# Patient Record
Sex: Male | Born: 1955 | Hispanic: No | Marital: Single | Smoking: Former smoker
Health system: Southern US, Community
[De-identification: ages and names within clinical notes are randomized; demographics above are authoritative.]

## PROBLEM LIST (undated history)

## (undated) DIAGNOSIS — J189 Pneumonia, unspecified organism: Secondary | ICD-10-CM

## (undated) DIAGNOSIS — Z9289 Personal history of other medical treatment: Secondary | ICD-10-CM

## (undated) HISTORY — PX: FRACTURE SURGERY: SHX138

---

## 2018-09-09 ENCOUNTER — Encounter (HOSPITAL_COMMUNITY): Admission: EM | Disposition: A | Discharge status: Skilled Nursing Facility | Payer: Self-pay | Source: Home / Self Care

## 2018-09-09 ENCOUNTER — Emergency Department (HOSPITAL_COMMUNITY): Payer: No Typology Code available for payment source

## 2018-09-09 ENCOUNTER — Encounter (HOSPITAL_COMMUNITY): Payer: Self-pay | Admitting: Emergency Medicine

## 2018-09-09 ENCOUNTER — Emergency Department (HOSPITAL_COMMUNITY): Payer: No Typology Code available for payment source | Admitting: Certified Registered Nurse Anesthetist

## 2018-09-09 ENCOUNTER — Inpatient Hospital Stay (HOSPITAL_COMMUNITY): Payer: No Typology Code available for payment source

## 2018-09-09 ENCOUNTER — Inpatient Hospital Stay (HOSPITAL_COMMUNITY)
Admission: EM | Admit: 2018-09-09 | Discharge: 2018-09-25 | DRG: 956 | Disposition: A | Discharge status: Skilled Nursing Facility | Payer: No Typology Code available for payment source | Attending: General Surgery | Admitting: General Surgery

## 2018-09-09 ENCOUNTER — Emergency Department (HOSPITAL_COMMUNITY): Payer: No Typology Code available for payment source | Admitting: Anesthesiology

## 2018-09-09 DIAGNOSIS — S2243XA Multiple fractures of ribs, bilateral, initial encounter for closed fracture: Secondary | ICD-10-CM | POA: Diagnosis present

## 2018-09-09 DIAGNOSIS — S32049A Unspecified fracture of fourth lumbar vertebra, initial encounter for closed fracture: Secondary | ICD-10-CM | POA: Diagnosis present

## 2018-09-09 DIAGNOSIS — R579 Shock, unspecified: Secondary | ICD-10-CM

## 2018-09-09 DIAGNOSIS — N390 Urinary tract infection, site not specified: Secondary | ICD-10-CM | POA: Diagnosis present

## 2018-09-09 DIAGNOSIS — S2231XA Fracture of one rib, right side, initial encounter for closed fracture: Secondary | ICD-10-CM

## 2018-09-09 DIAGNOSIS — S0219XA Other fracture of base of skull, initial encounter for closed fracture: Secondary | ICD-10-CM | POA: Diagnosis present

## 2018-09-09 DIAGNOSIS — S0282XA Fracture of other specified skull and facial bones, left side, initial encounter for closed fracture: Secondary | ICD-10-CM | POA: Diagnosis present

## 2018-09-09 DIAGNOSIS — S7292XA Unspecified fracture of left femur, initial encounter for closed fracture: Secondary | ICD-10-CM

## 2018-09-09 DIAGNOSIS — J9690 Respiratory failure, unspecified, unspecified whether with hypoxia or hypercapnia: Secondary | ICD-10-CM

## 2018-09-09 DIAGNOSIS — S12500A Unspecified displaced fracture of sixth cervical vertebra, initial encounter for closed fracture: Secondary | ICD-10-CM | POA: Diagnosis present

## 2018-09-09 DIAGNOSIS — S82832A Other fracture of upper and lower end of left fibula, initial encounter for closed fracture: Secondary | ICD-10-CM | POA: Diagnosis present

## 2018-09-09 DIAGNOSIS — S0181XA Laceration without foreign body of other part of head, initial encounter: Secondary | ICD-10-CM

## 2018-09-09 DIAGNOSIS — S27329A Contusion of lung, unspecified, initial encounter: Secondary | ICD-10-CM

## 2018-09-09 DIAGNOSIS — S72002A Fracture of unspecified part of neck of left femur, initial encounter for closed fracture: Secondary | ICD-10-CM

## 2018-09-09 DIAGNOSIS — S065X9A Traumatic subdural hemorrhage with loss of consciousness of unspecified duration, initial encounter: Secondary | ICD-10-CM | POA: Diagnosis present

## 2018-09-09 DIAGNOSIS — Z8781 Personal history of (healed) traumatic fracture: Secondary | ICD-10-CM

## 2018-09-09 DIAGNOSIS — S299XXA Unspecified injury of thorax, initial encounter: Secondary | ICD-10-CM

## 2018-09-09 DIAGNOSIS — I471 Supraventricular tachycardia: Secondary | ICD-10-CM | POA: Diagnosis present

## 2018-09-09 DIAGNOSIS — J9601 Acute respiratory failure with hypoxia: Secondary | ICD-10-CM | POA: Diagnosis present

## 2018-09-09 DIAGNOSIS — Z419 Encounter for procedure for purposes other than remedying health state, unspecified: Secondary | ICD-10-CM

## 2018-09-09 DIAGNOSIS — N5089 Other specified disorders of the male genital organs: Secondary | ICD-10-CM | POA: Diagnosis present

## 2018-09-09 DIAGNOSIS — S82209A Unspecified fracture of shaft of unspecified tibia, initial encounter for closed fracture: Secondary | ICD-10-CM

## 2018-09-09 DIAGNOSIS — S01112A Laceration without foreign body of left eyelid and periocular area, initial encounter: Secondary | ICD-10-CM | POA: Diagnosis present

## 2018-09-09 DIAGNOSIS — Z452 Encounter for adjustment and management of vascular access device: Secondary | ICD-10-CM

## 2018-09-09 DIAGNOSIS — S72451A Displaced supracondylar fracture without intracondylar extension of lower end of right femur, initial encounter for closed fracture: Secondary | ICD-10-CM | POA: Diagnosis present

## 2018-09-09 DIAGNOSIS — S2249XA Multiple fractures of ribs, unspecified side, initial encounter for closed fracture: Secondary | ICD-10-CM

## 2018-09-09 DIAGNOSIS — S82899A Other fracture of unspecified lower leg, initial encounter for closed fracture: Secondary | ICD-10-CM

## 2018-09-09 DIAGNOSIS — S020XXB Fracture of vault of skull, initial encounter for open fracture: Secondary | ICD-10-CM | POA: Diagnosis present

## 2018-09-09 DIAGNOSIS — S32059A Unspecified fracture of fifth lumbar vertebra, initial encounter for closed fracture: Secondary | ICD-10-CM | POA: Diagnosis present

## 2018-09-09 DIAGNOSIS — S32810A Multiple fractures of pelvis with stable disruption of pelvic ring, initial encounter for closed fracture: Secondary | ICD-10-CM | POA: Diagnosis present

## 2018-09-09 DIAGNOSIS — R52 Pain, unspecified: Secondary | ICD-10-CM

## 2018-09-09 DIAGNOSIS — S0240FA Zygomatic fracture, left side, initial encounter for closed fracture: Secondary | ICD-10-CM | POA: Diagnosis present

## 2018-09-09 DIAGNOSIS — Y92410 Unspecified street and highway as the place of occurrence of the external cause: Secondary | ICD-10-CM

## 2018-09-09 DIAGNOSIS — S72142A Displaced intertrochanteric fracture of left femur, initial encounter for closed fracture: Secondary | ICD-10-CM | POA: Diagnosis present

## 2018-09-09 DIAGNOSIS — S7222XA Displaced subtrochanteric fracture of left femur, initial encounter for closed fracture: Secondary | ICD-10-CM

## 2018-09-09 DIAGNOSIS — S27321A Contusion of lung, unilateral, initial encounter: Secondary | ICD-10-CM

## 2018-09-09 DIAGNOSIS — S82202A Unspecified fracture of shaft of left tibia, initial encounter for closed fracture: Secondary | ICD-10-CM

## 2018-09-09 DIAGNOSIS — D62 Acute posthemorrhagic anemia: Secondary | ICD-10-CM | POA: Diagnosis present

## 2018-09-09 DIAGNOSIS — S0240DA Maxillary fracture, left side, initial encounter for closed fracture: Secondary | ICD-10-CM | POA: Diagnosis present

## 2018-09-09 DIAGNOSIS — S7291XA Unspecified fracture of right femur, initial encounter for closed fracture: Secondary | ICD-10-CM

## 2018-09-09 DIAGNOSIS — S022XXA Fracture of nasal bones, initial encounter for closed fracture: Secondary | ICD-10-CM | POA: Diagnosis present

## 2018-09-09 DIAGNOSIS — S82252A Displaced comminuted fracture of shaft of left tibia, initial encounter for closed fracture: Secondary | ICD-10-CM | POA: Diagnosis present

## 2018-09-09 DIAGNOSIS — D696 Thrombocytopenia, unspecified: Secondary | ICD-10-CM | POA: Diagnosis present

## 2018-09-09 DIAGNOSIS — S42102A Fracture of unspecified part of scapula, left shoulder, initial encounter for closed fracture: Secondary | ICD-10-CM

## 2018-09-09 DIAGNOSIS — D72829 Elevated white blood cell count, unspecified: Secondary | ICD-10-CM

## 2018-09-09 DIAGNOSIS — Z9289 Personal history of other medical treatment: Secondary | ICD-10-CM

## 2018-09-09 DIAGNOSIS — S42112A Displaced fracture of body of scapula, left shoulder, initial encounter for closed fracture: Secondary | ICD-10-CM | POA: Diagnosis present

## 2018-09-09 DIAGNOSIS — S329XXA Fracture of unspecified parts of lumbosacral spine and pelvis, initial encounter for closed fracture: Secondary | ICD-10-CM

## 2018-09-09 DIAGNOSIS — R58 Hemorrhage, not elsewhere classified: Secondary | ICD-10-CM

## 2018-09-09 HISTORY — PX: EXTERNAL FIXATION LEG: SHX1549

## 2018-09-09 HISTORY — PX: FACIAL LACERATION REPAIR: SHX6589

## 2018-09-09 HISTORY — DX: Personal history of other medical treatment: Z92.89

## 2018-09-09 HISTORY — DX: Pneumonia, unspecified organism: J18.9

## 2018-09-09 LAB — POCT I-STAT 7, (LYTES, BLD GAS, ICA,H+H)
Acid-base deficit: 2 mmol/L (ref 0.0–2.0)
Acid-base deficit: 1 mmol/L (ref 0.0–2.0)
Bicarbonate: 25.6 mmol/L (ref 20.0–28.0)
Bicarbonate: 24.6 mmol/L (ref 20.0–28.0)
CALCIUM ION: 1.11 mmol/L — AB (ref 1.15–1.40)
Calcium, Ion: 1.08 mmol/L — ABNORMAL LOW (ref 1.15–1.40)
HEMATOCRIT: 33 % — AB (ref 39.0–52.0)
HEMATOCRIT: 27 % — AB (ref 39.0–52.0)
Hemoglobin: 11.2 g/dL — ABNORMAL LOW (ref 13.0–17.0)
Hemoglobin: 9.2 g/dL — ABNORMAL LOW (ref 13.0–17.0)
O2 SAT: 99 %
O2 SAT: 100 %
PCO2 ART: 43.4 mmHg (ref 32.0–48.0)
PO2 ART: 218 mmHg — AB (ref 83.0–108.0)
POTASSIUM: 3.7 mmol/L (ref 3.5–5.1)
Patient temperature: 35.2
Patient temperature: 36
Potassium: 3.5 mmol/L (ref 3.5–5.1)
SODIUM: 140 mmol/L (ref 135–145)
Sodium: 139 mmol/L (ref 135–145)
TCO2: 27 mmol/L (ref 22–32)
TCO2: 26 mmol/L (ref 22–32)
pCO2 arterial: 54.5 mmHg — ABNORMAL HIGH (ref 32.0–48.0)
pH, Arterial: 7.27 — ABNORMAL LOW (ref 7.350–7.450)
pH, Arterial: 7.357 (ref 7.350–7.450)
pO2, Arterial: 167 mmHg — ABNORMAL HIGH (ref 83.0–108.0)

## 2018-09-09 LAB — CBC WITH DIFFERENTIAL/PLATELET
ABS IMMATURE GRANULOCYTES: 0.37 10*3/uL — AB (ref 0.00–0.07)
BASOS PCT: 0 %
Basophils Absolute: 0.1 10*3/uL (ref 0.0–0.1)
Eosinophils Absolute: 0 10*3/uL (ref 0.0–0.5)
Eosinophils Relative: 0 %
HEMATOCRIT: 32.9 % — AB (ref 39.0–52.0)
Hemoglobin: 11 g/dL — ABNORMAL LOW (ref 13.0–17.0)
IMMATURE GRANULOCYTES: 2 %
LYMPHS ABS: 2.5 10*3/uL (ref 0.7–4.0)
Lymphocytes Relative: 10 %
MCH: 29.8 pg (ref 26.0–34.0)
MCHC: 33.4 g/dL (ref 30.0–36.0)
MCV: 89.2 fL (ref 80.0–100.0)
MONOS PCT: 7 %
Monocytes Absolute: 1.6 10*3/uL — ABNORMAL HIGH (ref 0.1–1.0)
NEUTROS ABS: 20.3 10*3/uL — AB (ref 1.7–7.7)
NEUTROS PCT: 81 %
PLATELETS: 124 10*3/uL — AB (ref 150–400)
RBC: 3.69 MIL/uL — ABNORMAL LOW (ref 4.22–5.81)
RDW: 14.9 % (ref 11.5–15.5)
WBC: 24.8 10*3/uL — ABNORMAL HIGH (ref 4.0–10.5)
nRBC: 0 % (ref 0.0–0.2)

## 2018-09-09 LAB — CBC
HCT: 40.8 % (ref 39.0–52.0)
Hemoglobin: 13.1 g/dL (ref 13.0–17.0)
MCH: 30.4 pg (ref 26.0–34.0)
MCHC: 32.1 g/dL (ref 30.0–36.0)
MCV: 94.7 fL (ref 80.0–100.0)
NRBC: 1.1 % — AB (ref 0.0–0.2)
Platelets: 299 10*3/uL (ref 150–400)
RBC: 4.31 MIL/uL (ref 4.22–5.81)
RDW: 14.7 % (ref 11.5–15.5)
WBC: 23.2 10*3/uL — AB (ref 4.0–10.5)

## 2018-09-09 LAB — I-STAT ARTERIAL BLOOD GAS, ED
Acid-base deficit: 2 mmol/L (ref 0.0–2.0)
Bicarbonate: 25.7 mmol/L (ref 20.0–28.0)
O2 Saturation: 99 %
PCO2 ART: 53.4 mmHg — AB (ref 32.0–48.0)
Patient temperature: 96.5
TCO2: 27 mmol/L (ref 22–32)
pH, Arterial: 7.285 — ABNORMAL LOW (ref 7.350–7.450)
pO2, Arterial: 175 mmHg — ABNORMAL HIGH (ref 83.0–108.0)

## 2018-09-09 LAB — I-STAT CHEM 8, ED
BUN: 9 mg/dL (ref 8–23)
CHLORIDE: 105 mmol/L (ref 98–111)
CREATININE: 1 mg/dL (ref 0.61–1.24)
Calcium, Ion: 1.07 mmol/L — ABNORMAL LOW (ref 1.15–1.40)
GLUCOSE: 140 mg/dL — AB (ref 70–99)
HCT: 40 % (ref 39.0–52.0)
Hemoglobin: 13.6 g/dL (ref 13.0–17.0)
POTASSIUM: 3.8 mmol/L (ref 3.5–5.1)
Sodium: 137 mmol/L (ref 135–145)
TCO2: 23 mmol/L (ref 22–32)

## 2018-09-09 LAB — I-STAT CG4 LACTIC ACID, ED: LACTIC ACID, VENOUS: 3.4 mmol/L — AB (ref 0.5–1.9)

## 2018-09-09 LAB — COMPREHENSIVE METABOLIC PANEL
ALK PHOS: 101 U/L (ref 38–126)
ALT: 48 U/L — AB (ref 0–44)
AST: 124 U/L — ABNORMAL HIGH (ref 15–41)
Albumin: 3 g/dL — ABNORMAL LOW (ref 3.5–5.0)
Anion gap: 8 (ref 5–15)
BUN: 9 mg/dL (ref 8–23)
CALCIUM: 8.3 mg/dL — AB (ref 8.9–10.3)
CO2: 20 mmol/L — ABNORMAL LOW (ref 22–32)
CREATININE: 1 mg/dL (ref 0.61–1.24)
Chloride: 105 mmol/L (ref 98–111)
GFR, EST AFRICAN AMERICAN: 51 mL/min — AB (ref >60)
GFR, EST NON AFRICAN AMERICAN: 44 mL/min — AB (ref >60)
Glucose, Bld: 133 mg/dL — ABNORMAL HIGH (ref 70–99)
Potassium: 4.2 mmol/L (ref 3.5–5.1)
Sodium: 133 mmol/L — ABNORMAL LOW (ref 135–145)
Total Bilirubin: 1.1 mg/dL (ref 0.3–1.2)
Total Protein: 6.6 g/dL (ref 6.5–8.1)

## 2018-09-09 LAB — TRIGLYCERIDES: TRIGLYCERIDES: 25 mg/dL (ref <150)

## 2018-09-09 LAB — ETHANOL: Alcohol, Ethyl (B): <10 mg/dL (ref <10)

## 2018-09-09 LAB — PROTIME-INR
INR: 1.05
Prothrombin Time: 13.6 seconds (ref 11.4–15.2)

## 2018-09-09 SURGERY — EXTERNAL FIXATION, LOWER EXTREMITY
Anesthesia: General | Site: Leg Upper

## 2018-09-09 SURGERY — LAPAROTOMY, EXPLORATORY
Anesthesia: General

## 2018-09-09 MED ORDER — ACETAMINOPHEN 650 MG RE SUPP: 650.0000 mg | Freq: Four times a day (QID) | RECTAL | Status: DC | PRN

## 2018-09-09 MED ORDER — PROPOFOL 1000 MG/100ML IV EMUL
5.0000 ug/kg/min | INTRAVENOUS | Status: DC
  Administered 2018-09-09: 30 ug/kg/min via INTRAVENOUS
  Administered 2018-09-10: 10 ug/kg/min via INTRAVENOUS
  Administered 2018-09-10 – 2018-09-11 (×3): 20 ug/kg/min via INTRAVENOUS
  Administered 2018-09-11: 30 ug/kg/min via INTRAVENOUS
  Administered 2018-09-12 – 2018-09-13 (×4): 20 ug/kg/min via INTRAVENOUS
  Administered 2018-09-13: 30 ug/kg/min via INTRAVENOUS
  Administered 2018-09-13 – 2018-09-14 (×3): 20 ug/kg/min via INTRAVENOUS
  Administered 2018-09-14: 30 ug/kg/min via INTRAVENOUS
  Administered 2018-09-14: 14:00:00 via INTRAVENOUS
  Administered 2018-09-15: 20 ug/kg/min via INTRAVENOUS
  Filled 2018-09-09: qty 100
  Filled 2018-09-09: qty 200
  Filled 2018-09-09 (×5): qty 100
  Filled 2018-09-09: qty 200
  Filled 2018-09-09 (×5): qty 100

## 2018-09-09 MED ORDER — MIDAZOLAM HCL 5 MG/5ML IJ SOLN
INTRAMUSCULAR | Status: DC | PRN
  Administered 2018-09-09: 2 mg via INTRAVENOUS

## 2018-09-09 MED ORDER — MIDAZOLAM HCL 2 MG/2ML IJ SOLN
4.0000 mg | Freq: Once | INTRAMUSCULAR | Status: AC
  Administered 2018-09-09: 4 mg via INTRAVENOUS
  Filled 2018-09-09: qty 4

## 2018-09-09 MED ORDER — MIDAZOLAM HCL 2 MG/2ML IJ SOLN
INTRAMUSCULAR | Status: AC
  Filled 2018-09-09: qty 2

## 2018-09-09 MED ORDER — BACITRACIN ZINC 500 UNIT/GM EX OINT
TOPICAL_OINTMENT | CUTANEOUS | Status: AC
  Filled 2018-09-09: qty 28.35

## 2018-09-09 MED ORDER — ROCURONIUM BROMIDE 10 MG/ML (PF) SYRINGE
PREFILLED_SYRINGE | INTRAVENOUS | Status: DC | PRN
  Administered 2018-09-09 (×2): 50 mg via INTRAVENOUS

## 2018-09-09 MED ORDER — PROPOFOL 500 MG/50ML IV EMUL
INTRAVENOUS | Status: AC | PRN
  Administered 2018-09-09: 29.394 ug/kg/min via INTRAVENOUS

## 2018-09-09 MED ORDER — TRANEXAMIC ACID-NACL 1000-0.7 MG/100ML-% IV SOLN
1000.0000 mg | Freq: Once | INTRAVENOUS | Status: AC
  Administered 2018-09-09: 1000 mg via INTRAVENOUS
  Filled 2018-09-09 (×2): qty 100

## 2018-09-09 MED ORDER — FENTANYL BOLUS VIA INFUSION
50.0000 ug | INTRAVENOUS | Status: DC | PRN
  Administered 2018-09-10 – 2018-09-11 (×3): 50 ug via INTRAVENOUS
  Filled 2018-09-09: qty 50

## 2018-09-09 MED ORDER — FENTANYL CITRATE (PF) 250 MCG/5ML IJ SOLN
INTRAMUSCULAR | Status: AC
  Filled 2018-09-09: qty 5

## 2018-09-09 MED ORDER — FENTANYL 2500MCG IN NS 250ML (10MCG/ML) PREMIX INFUSION
25.0000 ug/h | INTRAVENOUS | Status: DC
  Administered 2018-09-10: 50 ug/h via INTRAVENOUS
  Administered 2018-09-11: 75 ug/h via INTRAVENOUS
  Administered 2018-09-12: 100 ug/h via INTRAVENOUS
  Administered 2018-09-13 – 2018-09-14 (×3): 150 ug/h via INTRAVENOUS
  Administered 2018-09-15: 200 ug/h via INTRAVENOUS
  Filled 2018-09-09 (×7): qty 250

## 2018-09-09 MED ORDER — 0.9 % SODIUM CHLORIDE (POUR BTL) OPTIME
TOPICAL | Status: DC | PRN
  Administered 2018-09-09: 1000 mL

## 2018-09-09 MED ORDER — ACETAMINOPHEN 325 MG PO TABS: 650.0000 mg | ORAL_TABLET | Freq: Four times a day (QID) | ORAL | Status: DC | PRN

## 2018-09-09 MED ORDER — VECURONIUM BROMIDE 10 MG IV SOLR
10.0000 mg | Freq: Once | INTRAVENOUS | Status: AC
  Administered 2018-09-09: 10 mg via INTRAVENOUS

## 2018-09-09 MED ORDER — FENTANYL CITRATE (PF) 250 MCG/5ML IJ SOLN
INTRAMUSCULAR | Status: DC | PRN
  Administered 2018-09-09: 50 ug via INTRAVENOUS

## 2018-09-09 MED ORDER — PHENYLEPHRINE HCL 10 MG/ML IJ SOLN
INTRAMUSCULAR | Status: DC | PRN
  Administered 2018-09-09: 120 ug via INTRAVENOUS

## 2018-09-09 MED ORDER — LACTATED RINGERS IV SOLN
INTRAVENOUS | Status: DC | PRN
  Administered 2018-09-09: 22:00:00 via INTRAVENOUS

## 2018-09-09 MED ORDER — MIDAZOLAM HCL 2 MG/2ML IJ SOLN: 2.0000 mg | INTRAMUSCULAR | Status: DC | PRN

## 2018-09-09 MED ORDER — IOHEXOL 300 MG/ML  SOLN
100.0000 mL | Freq: Once | INTRAMUSCULAR | Status: AC | PRN
  Administered 2018-09-09: 100 mL via INTRAVENOUS

## 2018-09-09 MED ORDER — MIDAZOLAM HCL 5 MG/5ML IJ SOLN
INTRAMUSCULAR | Status: AC | PRN
  Administered 2018-09-09: 4 mg via INTRAVENOUS

## 2018-09-09 MED ORDER — SODIUM CHLORIDE 0.9 % IV SOLN
INTRAVENOUS | Status: DC
  Administered 2018-09-09 – 2018-09-13 (×10): via INTRAVENOUS

## 2018-09-09 MED ORDER — SODIUM CHLORIDE 0.9 % IV SOLN
INTRAVENOUS | Status: DC | PRN
  Administered 2018-09-09: 75 ug/min via INTRAVENOUS

## 2018-09-09 MED ORDER — FENTANYL CITRATE (PF) 100 MCG/2ML IJ SOLN: 50.0000 ug | Freq: Once | INTRAMUSCULAR | Status: DC

## 2018-09-09 MED ORDER — PROPOFOL 1000 MG/100ML IV EMUL: 0.0000 ug/kg/min | INTRAVENOUS | Status: DC

## 2018-09-09 MED ORDER — ETOMIDATE 2 MG/ML IV SOLN
INTRAVENOUS | Status: AC | PRN
  Administered 2018-09-09: 30 mg via INTRAVENOUS

## 2018-09-09 MED ORDER — FAMOTIDINE IN NACL 20-0.9 MG/50ML-% IV SOLN
20.0000 mg | Freq: Two times a day (BID) | INTRAVENOUS | Status: DC
  Administered 2018-09-10 – 2018-09-17 (×15): 20 mg via INTRAVENOUS
  Filled 2018-09-09 (×15): qty 50

## 2018-09-09 MED ORDER — ALBUMIN HUMAN 5 % IV SOLN
INTRAVENOUS | Status: DC | PRN
  Administered 2018-09-09: 23:00:00 via INTRAVENOUS

## 2018-09-09 MED ORDER — SUCCINYLCHOLINE CHLORIDE 20 MG/ML IJ SOLN
INTRAMUSCULAR | Status: AC | PRN
  Administered 2018-09-09: 100 mg via INTRAVENOUS

## 2018-09-09 SURGICAL SUPPLY — 51 items
BANDAGE ACE 4X5 VEL STRL LF (GAUZE/BANDAGES/DRESSINGS) ×2 IMPLANT
BANDAGE ACE 6X5 VEL STRL LF (GAUZE/BANDAGES/DRESSINGS) ×14 IMPLANT
BANDAGE ESMARK 6X9 LF (GAUZE/BANDAGES/DRESSINGS) ×2 IMPLANT
BAR EXFX 500X11 NS LF (EXFIX) ×8
BAR GLASS FIBER EXFX 11X500 (EXFIX) ×12 IMPLANT
BNDG CMPR 9X6 STRL LF SNTH (GAUZE/BANDAGES/DRESSINGS)
BNDG COHESIVE 6X5 TAN STRL LF (GAUZE/BANDAGES/DRESSINGS) ×6 IMPLANT
BNDG ESMARK 6X9 LF (GAUZE/BANDAGES/DRESSINGS)
BNDG GAUZE ELAST 4 BULKY (GAUZE/BANDAGES/DRESSINGS) ×2 IMPLANT
CLEANER TIP ELECTROSURG 2X2 (MISCELLANEOUS) ×4 IMPLANT
COVER SURGICAL LIGHT HANDLE (MISCELLANEOUS) ×4 IMPLANT
COVER WAND RF STERILE (DRAPES) ×4 IMPLANT
DRAPE C-ARM 42X72 X-RAY (DRAPES) ×2 IMPLANT
DRAPE U-SHAPE 47X51 STRL (DRAPES) ×4 IMPLANT
ELECT REM PT RETURN 9FT ADLT (ELECTROSURGICAL)
ELECTRODE REM PT RTRN 9FT ADLT (ELECTROSURGICAL) ×2 IMPLANT
GAUZE SPONGE 4X4 12PLY STRL (GAUZE/BANDAGES/DRESSINGS) ×12 IMPLANT
GLOVE BIOGEL PI IND STRL 8 (GLOVE) ×2 IMPLANT
GLOVE BIOGEL PI INDICATOR 8 (GLOVE) ×6
GLOVE SURG ORTHO 8.0 STRL STRW (GLOVE) ×4 IMPLANT
GOWN STRL REUS W/ TWL LRG LVL3 (GOWN DISPOSABLE) ×5 IMPLANT
GOWN STRL REUS W/TWL LRG LVL3 (GOWN DISPOSABLE) ×8
KIT BASIN OR (CUSTOM PROCEDURE TRAY) ×4 IMPLANT
KIT TURNOVER KIT B (KITS) ×4 IMPLANT
NS IRRIG 1000ML POUR BTL (IV SOLUTION) ×4 IMPLANT
PACK ORTHO EXTREMITY (CUSTOM PROCEDURE TRAY) ×4 IMPLANT
PAD ARMBOARD 7.5X6 YLW CONV (MISCELLANEOUS) ×8 IMPLANT
PADDING CAST COTTON 6X4 STRL (CAST SUPPLIES) ×3 IMPLANT
PIN CLAMP 2BAR 75MM BLUE (EXFIX) ×8 IMPLANT
PIN HALF ORANGE 5X200X45MM (EXFIX) ×12 IMPLANT
PIN HALF YELLOW 5X160X35 (EXFIX) ×12 IMPLANT
SPONGE LAP 18X18 X RAY DECT (DISPOSABLE) ×4 IMPLANT
STAPLER VISISTAT 35W (STAPLE) IMPLANT
STOCKINETTE IMPERVIOUS LG (DRAPES) ×7 IMPLANT
SUCTION FRAZIER HANDLE 10FR (MISCELLANEOUS)
SUCTION TUBE FRAZIER 10FR DISP (MISCELLANEOUS) IMPLANT
SUT ETHILON 3 0 PS 1 (SUTURE) IMPLANT
SUT ETHILON 5 0 P 3 18 (SUTURE) ×8
SUT NYLON ETHILON 5-0 P-3 1X18 (SUTURE) ×4 IMPLANT
SUT VIC AB 0 CT1 27 (SUTURE)
SUT VIC AB 0 CT1 27XBRD ANBCTR (SUTURE) ×4 IMPLANT
SUT VIC AB 2-0 CT1 27 (SUTURE)
SUT VIC AB 2-0 CT1 TAPERPNT 27 (SUTURE) ×2 IMPLANT
SYR CONTROL 10ML LL (SYRINGE) IMPLANT
TOWEL OR 17X24 6PK STRL BLUE (TOWEL DISPOSABLE) ×8 IMPLANT
TOWEL OR 17X26 10 PK STRL BLUE (TOWEL DISPOSABLE) ×4 IMPLANT
TUBE CONNECTING 12'X1/4 (SUCTIONS) ×1
TUBE CONNECTING 12X1/4 (SUCTIONS) ×3 IMPLANT
UNDERPAD 30X30 (UNDERPADS AND DIAPERS) ×4 IMPLANT
WATER STERILE IRR 1000ML POUR (IV SOLUTION) ×6 IMPLANT
YANKAUER SUCT BULB TIP NO VENT (SUCTIONS) ×4 IMPLANT

## 2018-09-09 NOTE — Progress Notes (Signed)
Patient ID: Derek Moon, male   DOB: 11/11/1877, 61 y.o.   MRN: [id number] Patient currently in the CT scanner so we'll defer exam until later,  reviewed head CT which shows bifrontal contusions worse on the left left frontal sinus and orbital fractures. Preliminary review of cervical spine appears negative. Patient apparently was awake but combative when he came in to the ER prior to being sedated and intubated. We'll defer any placement of ICP for now.

## 2018-09-09 NOTE — ED Notes (Signed)
Pt  Has no ID in belongings- trooper will attempt to locate person that car is registered to-

## 2018-09-09 NOTE — ED Notes (Signed)
Ancef 2 Gm IVPB

## 2018-09-09 NOTE — Progress Notes (Signed)
Patient came to 4N30 from OR. RT placed patient on ventilator. Patient tolerating well at this time. RT will monitor as needed.

## 2018-09-09 NOTE — Brief Op Note (Signed)
09/09/2018  11:17 PM  PATIENT:  Salem Doe V  62 y.o. male  PRE-OPERATIVE DIAGNOSIS:  Bilateral Femur And Pelvic Fractures  POST-OPERATIVE DIAGNOSIS:  Bilateral Femur And Pelvic Fractures  PROCEDURE:  Procedure(s): EXTERNAL FIXATION right distal femur fracture with knee spanning external fixation and left tibial shaft fracture with knee spanning external fixation FACIAL LACERATION REPAIR  SURGEON:  Surgeon(s): August Saucer, Corrie Mckusick, MD Suzanna Obey, MD  ASSISTANT: none  ANESTHESIA:   general  EBL: 15 ml    Total I/O In: 4873 [I.V.:4523; IV Piggyback:350] Out: 0   BLOOD ADMINISTERED: none  DRAINS: none   LOCAL MEDICATIONS USED:  none  SPECIMEN:  No Specimen  COUNTS:  YES  TOURNIQUET:  * No tourniquets in log *  DICTATION: .Other Dictation: Dictation [id number]  PLAN OF CARE: Admit to inpatient   PATIENT DISPOSITION:  PACU - hemodynamically stable

## 2018-09-09 NOTE — Anesthesia Procedure Notes (Signed)
Arterial Line Insertion Start/End10/30/2019 9:47 PM, 09/09/2018 9:50 PM Performed by: Melina Schools, CRNA, CRNA  Preanesthetic checklist: patient identified, IV checked and monitors and equipment checked Patient sedated Left, radial was placed Catheter size: 20 G Hand hygiene performed  and maximum sterile barriers used   Attempts: 1 Procedure performed without using ultrasound guided technique. Following insertion, Biopatch. Post procedure assessment: normal  Patient tolerated the procedure well with no immediate complications.
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
## 2018-09-09 NOTE — Anesthesia Preprocedure Evaluation (Addendum)
Anesthesia Evaluation  Patient identified by MRN, date of birth, ID band Patient unresponsive    Reviewed: Patient's Chart, lab work & pertinent test results, Unable to perform ROS - Chart review onlyPreop documentation limited or incomplete due to emergent nature of procedure.  Airway Mallampati: Intubated       Dental no notable dental hx. (+) Teeth Intact, Dental Advisory Given   Pulmonary neg pulmonary ROS,    Pulmonary exam normal breath sounds clear to auscultation       Cardiovascular negative cardio ROS   Rhythm:Regular Rate:Normal     Neuro/Psych negative neurological ROS  negative psych ROS   GI/Hepatic negative GI ROS, Neg liver ROS,   Endo/Other  negative endocrine ROS  Renal/GU negative Renal ROS  negative genitourinary   Musculoskeletal   Abdominal   Peds  Hematology negative hematology ROS (+)   Anesthesia Other Findings   Reproductive/Obstetrics negative OB ROS                             Anesthesia Physical Anesthesia Plan  ASA: II and emergent  Anesthesia Plan: General   Post-op Pain Management:    Induction: Intravenous  PONV Risk Score and Plan: 2 and Midazolam and Ondansetron  Airway Management Planned: Oral ETT  Additional Equipment: CVP, Ultrasound Guidance Line Placement and Arterial line  Intra-op Plan:   Post-operative Plan: Post-operative intubation/ventilation  Informed Consent: I have reviewed the patients History and Physical, chart, labs and discussed the procedure including the risks, benefits and alternatives for the proposed anesthesia with the patient or authorized representative who has indicated his/her understanding and acceptance.   Dental advisory given  Plan Discussed with: CRNA  Anesthesia Plan Comments:        Anesthesia Quick Evaluation
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
## 2018-09-09 NOTE — ED Notes (Signed)
To CT °
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
## 2018-09-09 NOTE — Anesthesia Procedure Notes (Signed)
Central Venous Catheter Insertion Performed by: Gaynelle Adu, MD, anesthesiologist Start/End10/30/2019 9:40 PM, 09/09/2018 9:50 PM Patient location: Pre-op. Preanesthetic checklist: patient identified, IV checked, site marked, risks and benefits discussed, surgical consent, monitors and equipment checked, pre-op evaluation, timeout performed and anesthesia consent Position: Trendelenburg Lidocaine 1% used for infiltration and patient sedated Hand hygiene performed , maximum sterile barriers used  and Seldinger technique used Catheter size: 8 Fr Total catheter length 16. Central line was placed.Double lumen Procedure performed without using ultrasound guided technique. Attempts: 1 Following insertion, dressing applied, line sutured and Biopatch. Post procedure assessment: blood return through all ports  Patient tolerated the procedure well with no immediate complications.
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
## 2018-09-09 NOTE — Transfer of Care (Signed)
Immediate Anesthesia Transfer of Care Note  Patient: Derek Moon  Procedure(s) Performed: EXTERNAL FIXATION BILATERAL FEMUR, PELVIC FRACTURES (N/A Leg Upper) FACIAL LACERATION REPAIR (Left Eye)  Patient Location: ICU  Anesthesia Type:General  Level of Consciousness: Patient remains intubated per anesthesia plan  Airway & Oxygen Therapy: Patient remains intubated per anesthesia plan and Patient placed on Ventilator (see vital sign flow sheet for setting)  Post-op Assessment: Report given to RN and Post -op Vital signs reviewed and stable  Post vital signs: Reviewed and stable  Last Vitals:  Vitals Value Taken Time  BP 114/76 09/09/2018 11:25 PM  Temp 36 C 09/09/2018 11:28 PM  Pulse 101 09/09/2018 11:28 PM  Resp 20 09/09/2018 11:28 PM  SpO2 84 % 09/09/2018 11:28 PM  Vitals shown include unvalidated device data.  Last Pain:  Vitals:   09/09/18 1850  PainSc: 10-Worst pain ever         Complications: No apparent anesthesia complications
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
## 2018-09-09 NOTE — ED Notes (Signed)
Assumed care on pt. , patient received #7 and #8 PRBC units while at CT scan , # 5 and #6 FFP completed after returning from CT scan , Hare traction intact at right leg , ETT/OGT intact .
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
## 2018-09-09 NOTE — Anesthesia Preprocedure Evaluation (Deleted)
Anesthesia Evaluation  Patient identified by MRN, date of birth, ID band Patient awake    Reviewed: Allergy & Precautions, H&P , NPO status , Patient's Chart, lab work & pertinent test results  Airway        Dental no notable dental hx.    Pulmonary neg pulmonary ROS,    Pulmonary exam normal        Cardiovascular negative cardio ROS       Neuro/Psych negative neurological ROS  negative psych ROS   GI/Hepatic negative GI ROS, Neg liver ROS,   Endo/Other  negative endocrine ROS  Renal/GU negative Renal ROS  negative genitourinary   Musculoskeletal   Abdominal   Peds  Hematology negative hematology ROS (+)   Anesthesia Other Findings   Reproductive/Obstetrics negative OB ROS                             Anesthesia Physical Anesthesia Plan  ASA: III and emergent  Anesthesia Plan: General   Post-op Pain Management:    Induction: Intravenous, Rapid sequence and Cricoid pressure planned  PONV Risk Score and Plan: 3 and Ondansetron, Dexamethasone and Midazolam  Airway Management Planned: Oral ETT  Additional Equipment:   Intra-op Plan:   Post-operative Plan: Extubation in OR and Possible Post-op intubation/ventilation  Informed Consent: I have reviewed the patients History and Physical, chart, labs and discussed the procedure including the risks, benefits and alternatives for the proposed anesthesia with the patient or authorized representative who has indicated his/her understanding and acceptance.   Dental advisory given  Plan Discussed with: CRNA  Anesthesia Plan Comments:         Anesthesia Quick Evaluation

## 2018-09-09 NOTE — Anesthesia Postprocedure Evaluation (Signed)
Anesthesia Post Note  Patient: Derek Moon  Procedure(s) Performed: EXTERNAL FIXATION BILATERAL FEMUR, PELVIC FRACTURES (N/A Leg Upper) FACIAL LACERATION REPAIR (Left Eye)     Patient location during evaluation: SICU Anesthesia Type: General Level of consciousness: sedated Pain management: pain level controlled Vital Signs Assessment: post-procedure vital signs reviewed and stable Respiratory status: patient remains intubated per anesthesia plan Cardiovascular status: stable Postop Assessment: no apparent nausea or vomiting Anesthetic complications: no    Last Vitals:  Vitals:   09/09/18 2121 09/09/18 2325  BP: 106/72 114/76  Pulse: (!) 104 (!) 101  Resp: 15 20  Temp:  (!) 36 C  SpO2: 100% 99%    Last Pain:  Vitals:   09/09/18 2325  TempSrc: Bladder  PainSc:                  Syanna Remmert,W. EDMOND

## 2018-09-10 ENCOUNTER — Inpatient Hospital Stay (HOSPITAL_COMMUNITY): Payer: No Typology Code available for payment source

## 2018-09-10 ENCOUNTER — Encounter (HOSPITAL_COMMUNITY): Payer: Self-pay | Admitting: Orthopedic Surgery

## 2018-09-10 LAB — BASIC METABOLIC PANEL
Anion gap: 3 — ABNORMAL LOW (ref 5–15)
BUN: 16 mg/dL (ref 8–23)
CALCIUM: 7.3 mg/dL — AB (ref 8.9–10.3)
CO2: 24 mmol/L (ref 22–32)
Chloride: 110 mmol/L (ref 98–111)
Creatinine, Ser: 1.12 mg/dL (ref 0.61–1.24)
GFR calc Af Amer: 44 mL/min — ABNORMAL LOW (ref >60)
GFR, EST NON AFRICAN AMERICAN: 38 mL/min — AB (ref >60)
GLUCOSE: 137 mg/dL — AB (ref 70–99)
Potassium: 3.7 mmol/L (ref 3.5–5.1)
SODIUM: 137 mmol/L (ref 135–145)

## 2018-09-10 LAB — BPAM PLATELET PHERESIS
BLOOD PRODUCT EXPIRATION DATE: 201911012359
ISSUE DATE / TIME: 201910301914
Unit Type and Rh: 7300

## 2018-09-10 LAB — CBC
HCT: 23.9 % — ABNORMAL LOW (ref 39.0–52.0)
HCT: 28.1 % — ABNORMAL LOW (ref 39.0–52.0)
HEMOGLOBIN: 9.5 g/dL — AB (ref 13.0–17.0)
Hemoglobin: 7.9 g/dL — ABNORMAL LOW (ref 13.0–17.0)
MCH: 29.3 pg (ref 26.0–34.0)
MCH: 29.5 pg (ref 26.0–34.0)
MCHC: 33.1 g/dL (ref 30.0–36.0)
MCHC: 33.8 g/dL (ref 30.0–36.0)
MCV: 88.5 fL (ref 80.0–100.0)
MCV: 87.3 fL (ref 80.0–100.0)
NRBC: 0 % (ref 0.0–0.2)
PLATELETS: 86 10*3/uL — AB (ref 150–400)
Platelets: 71 10*3/uL — ABNORMAL LOW (ref 150–400)
RBC: 2.7 MIL/uL — ABNORMAL LOW (ref 4.22–5.81)
RBC: 3.22 MIL/uL — ABNORMAL LOW (ref 4.22–5.81)
RDW: 15.7 % — AB (ref 11.5–15.5)
RDW: 15.1 % (ref 11.5–15.5)
WBC: 16.9 10*3/uL — ABNORMAL HIGH (ref 4.0–10.5)
WBC: 15.8 10*3/uL — ABNORMAL HIGH (ref 4.0–10.5)
nRBC: 0 % (ref 0.0–0.2)

## 2018-09-10 LAB — PREPARE FRESH FROZEN PLASMA
UNIT DIVISION: 0
UNIT DIVISION: 0
UNIT DIVISION: 0
UNIT DIVISION: 0
UNIT DIVISION: 0
UNIT DIVISION: 0
Unit division: 0
Unit division: 0
Unit division: 0
Unit division: 0

## 2018-09-10 LAB — URINALYSIS, ROUTINE W REFLEX MICROSCOPIC
BILIRUBIN URINE: NEGATIVE
Bacteria, UA: NONE SEEN
GLUCOSE, UA: NEGATIVE mg/dL
Ketones, ur: NEGATIVE mg/dL
NITRITE: NEGATIVE
PH: 5 (ref 5.0–8.0)
Protein, ur: NEGATIVE mg/dL
Specific Gravity, Urine: 1.024 (ref 1.005–1.030)

## 2018-09-10 LAB — BPAM FFP
BLOOD PRODUCT EXPIRATION DATE: 201910312359
BLOOD PRODUCT EXPIRATION DATE: 201910312359
BLOOD PRODUCT EXPIRATION DATE: 201910312359
BLOOD PRODUCT EXPIRATION DATE: 201910312359
BLOOD PRODUCT EXPIRATION DATE: 201910312359
BLOOD PRODUCT EXPIRATION DATE: 201911042359
BLOOD PRODUCT EXPIRATION DATE: 201911042359
BLOOD PRODUCT EXPIRATION DATE: 201911182359
Blood Product Expiration Date: 201910312359
Blood Product Expiration Date: 201910312359
Blood Product Expiration Date: 201910312359
Blood Product Expiration Date: 201910312359
Blood Product Expiration Date: 201910312359
Blood Product Expiration Date: 201910312359
Blood Product Expiration Date: 201910312359
Blood Product Expiration Date: 201911042359
Blood Product Expiration Date: 201911042359
Blood Product Expiration Date: 201911212359
ISSUE DATE / TIME: 201910300826
ISSUE DATE / TIME: 201910301848
ISSUE DATE / TIME: 201910301848
ISSUE DATE / TIME: 201910301900
ISSUE DATE / TIME: 201910301922
ISSUE DATE / TIME: 201910301922
ISSUE DATE / TIME: 201910301922
ISSUE DATE / TIME: 201910301927
ISSUE DATE / TIME: 201910301929
ISSUE DATE / TIME: 201910301929
ISSUE DATE / TIME: 201910300826
ISSUE DATE / TIME: 201910301900
ISSUE DATE / TIME: 201910301922
ISSUE DATE / TIME: 201910301927
UNIT TYPE AND RH: 2800
UNIT TYPE AND RH: 5100
UNIT TYPE AND RH: 600
UNIT TYPE AND RH: 6200
UNIT TYPE AND RH: 6200
UNIT TYPE AND RH: 6200
UNIT TYPE AND RH: 6200
Unit Type and Rh: 5100
Unit Type and Rh: 5100
Unit Type and Rh: 6200
Unit Type and Rh: 6200
Unit Type and Rh: 6200
Unit Type and Rh: 6200
Unit Type and Rh: 6200
Unit Type and Rh: 6200
Unit Type and Rh: 6200
Unit Type and Rh: 8400
Unit Type and Rh: 9500

## 2018-09-10 LAB — BPAM CRYOPRECIPITATE
Blood Product Expiration Date: 201910310117
ISSUE DATE / TIME: 201910301947
Unit Type and Rh: 5100

## 2018-09-10 LAB — PREPARE PLATELET PHERESIS: UNIT DIVISION: 0

## 2018-09-10 LAB — CDS SEROLOGY

## 2018-09-10 LAB — PREPARE CRYOPRECIPITATE: Unit division: 0

## 2018-09-10 LAB — LACTIC ACID, PLASMA: Lactic Acid, Venous: 1.8 mmol/L (ref 0.5–1.9)

## 2018-09-10 LAB — MASSIVE TRANSFUSION PROTOCOL ORDER (BLOOD BANK NOTIFICATION)

## 2018-09-10 LAB — BLOOD PRODUCT ORDER (VERBAL) VERIFICATION

## 2018-09-10 LAB — TRIGLYCERIDES: Triglycerides: 53 mg/dL

## 2018-09-10 LAB — PREPARE RBC (CROSSMATCH)

## 2018-09-10 MED ORDER — ALBUMIN HUMAN 5 % IV SOLN
INTRAVENOUS | Status: AC
  Filled 2018-09-10: qty 250

## 2018-09-10 MED ORDER — SODIUM CHLORIDE 0.9 % IV BOLUS
500.0000 mL | Freq: Once | INTRAVENOUS | Status: AC
  Administered 2018-09-10: 500 mL via INTRAVENOUS

## 2018-09-10 MED ORDER — ALBUMIN HUMAN 25 % IV SOLN
12.5000 g | Freq: Once | INTRAVENOUS | Status: AC
  Administered 2018-09-10: 12.5 g via INTRAVENOUS
  Filled 2018-09-10: qty 50

## 2018-09-10 MED ORDER — CHLORHEXIDINE GLUCONATE 0.12% ORAL RINSE (MEDLINE KIT)
15.0000 mL | Freq: Two times a day (BID) | OROMUCOSAL | Status: DC
  Administered 2018-09-10 – 2018-09-15 (×12): 15 mL via OROMUCOSAL

## 2018-09-10 MED ORDER — ORAL CARE MOUTH RINSE
15.0000 mL | OROMUCOSAL | Status: DC
  Administered 2018-09-10 – 2018-09-15 (×53): 15 mL via OROMUCOSAL

## 2018-09-10 MED ORDER — ALBUMIN HUMAN 5 % IV SOLN
25.0000 g | Freq: Once | INTRAVENOUS | Status: AC
  Administered 2018-09-10: 25 g via INTRAVENOUS

## 2018-09-10 MED ORDER — SODIUM CHLORIDE 0.9% IV SOLUTION
Freq: Once | INTRAVENOUS | Status: AC
  Administered 2018-09-10: 10:00:00 via INTRAVENOUS
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
## 2018-09-10 NOTE — Progress Notes (Signed)
Patient transported from 4N30 to CT and back with no complications.

## 2018-09-10 NOTE — Progress Notes (Signed)
Spoke to Trauma MD Lindie Spruce about patient's low blood pressure and that propofol has been stopped for the time being. New orders received for 500 mL of 5% albumin, to be followed by a 500 mL bolus of normal saline if needed. Will continue to monitor.

## 2018-09-10 NOTE — Progress Notes (Signed)
Patient ID: Derek Moon, male   DOB: 11/11/1877, 62 y.o.   MRN: [id number] Patient is sedated however with still able to awaken and follow commands.  Seems to move all extremities well maybe less so in left upper extremity   CT head stable CT C-spine small TP fracture of C6.
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
## 2018-09-11 ENCOUNTER — Inpatient Hospital Stay (HOSPITAL_COMMUNITY): Payer: No Typology Code available for payment source

## 2018-09-11 ENCOUNTER — Encounter (HOSPITAL_COMMUNITY): Admission: EM | Disposition: A | Discharge status: Skilled Nursing Facility | Payer: Self-pay | Source: Home / Self Care

## 2018-09-11 ENCOUNTER — Inpatient Hospital Stay (HOSPITAL_COMMUNITY): Payer: No Typology Code available for payment source | Admitting: Anesthesiology

## 2018-09-11 ENCOUNTER — Encounter (HOSPITAL_COMMUNITY): Payer: Self-pay | Admitting: Anesthesiology

## 2018-09-11 DIAGNOSIS — S82402A Unspecified fracture of shaft of left fibula, initial encounter for closed fracture: Secondary | ICD-10-CM

## 2018-09-11 DIAGNOSIS — S7292XA Unspecified fracture of left femur, initial encounter for closed fracture: Secondary | ICD-10-CM

## 2018-09-11 DIAGNOSIS — S329XXA Fracture of unspecified parts of lumbosacral spine and pelvis, initial encounter for closed fracture: Secondary | ICD-10-CM

## 2018-09-11 DIAGNOSIS — S82202A Unspecified fracture of shaft of left tibia, initial encounter for closed fracture: Secondary | ICD-10-CM

## 2018-09-11 DIAGNOSIS — S7291XA Unspecified fracture of right femur, initial encounter for closed fracture: Secondary | ICD-10-CM

## 2018-09-11 HISTORY — PX: ORIF PELVIC FRACTURE WITH PERCUTANEOUS SCREWS: SHX6800

## 2018-09-11 HISTORY — PX: INTRAMEDULLARY (IM) NAIL INTERTROCHANTERIC: SHX5875

## 2018-09-11 LAB — POCT I-STAT 3, ART BLOOD GAS (G3+)
ACID-BASE DEFICIT: 6 mmol/L — AB (ref 0.0–2.0)
Acid-base deficit: 3 mmol/L — ABNORMAL HIGH (ref 0.0–2.0)
Bicarbonate: 23.6 mmol/L (ref 20.0–28.0)
Bicarbonate: 23.8 mmol/L (ref 20.0–28.0)
O2 Saturation: 97 %
O2 Saturation: 98 %
PH ART: 7.282 — AB (ref 7.350–7.450)
Patient temperature: 35.6
Patient temperature: 35.8
TCO2: 25 mmol/L (ref 22–32)
TCO2: 25 mmol/L (ref 22–32)
pCO2 arterial: 49.6 mmHg — ABNORMAL HIGH (ref 32.0–48.0)
pCO2 arterial: 59.1 mmHg — ABNORMAL HIGH (ref 32.0–48.0)
pH, Arterial: 7.202 — ABNORMAL LOW (ref 7.350–7.450)
pO2, Arterial: 104 mmHg (ref 83.0–108.0)
pO2, Arterial: 106 mmHg (ref 83.0–108.0)

## 2018-09-11 LAB — POCT I-STAT 4, (NA,K, GLUC, HGB,HCT)
GLUCOSE: 124 mg/dL — AB (ref 70–99)
HEMATOCRIT: 26 % — AB (ref 39.0–52.0)
Hemoglobin: 8.8 g/dL — ABNORMAL LOW (ref 13.0–17.0)
POTASSIUM: 4 mmol/L (ref 3.5–5.1)
SODIUM: 144 mmol/L (ref 135–145)

## 2018-09-11 LAB — BASIC METABOLIC PANEL
ANION GAP: 2 — AB (ref 5–15)
BUN: 18 mg/dL (ref 8–23)
CO2: 24 mmol/L (ref 22–32)
Calcium: 7.5 mg/dL — ABNORMAL LOW (ref 8.9–10.3)
Chloride: 113 mmol/L — ABNORMAL HIGH (ref 98–111)
Creatinine, Ser: 0.78 mg/dL (ref 0.61–1.24)
GFR calc Af Amer: >60 mL/min (ref >60)
GFR, EST NON AFRICAN AMERICAN: 53 mL/min — AB (ref >60)
Glucose, Bld: 112 mg/dL — ABNORMAL HIGH (ref 70–99)
Potassium: 3.4 mmol/L — ABNORMAL LOW (ref 3.5–5.1)
SODIUM: 139 mmol/L (ref 135–145)

## 2018-09-11 LAB — CBC
HCT: 25.9 % — ABNORMAL LOW (ref 39.0–52.0)
Hemoglobin: 8.3 g/dL — ABNORMAL LOW (ref 13.0–17.0)
MCH: 28.5 pg (ref 26.0–34.0)
MCHC: 32 g/dL (ref 30.0–36.0)
MCV: 89 fL (ref 80.0–100.0)
NRBC: 0 % (ref 0.0–0.2)
PLATELETS: 69 10*3/uL — AB (ref 150–400)
RBC: 2.91 MIL/uL — ABNORMAL LOW (ref 4.22–5.81)
RDW: 15.7 % — ABNORMAL HIGH (ref 11.5–15.5)
WBC: 16.8 10*3/uL — AB (ref 4.0–10.5)

## 2018-09-11 LAB — SURGICAL PCR SCREEN
MRSA, PCR: NEGATIVE
Staphylococcus aureus: POSITIVE — AB

## 2018-09-11 LAB — PREPARE RBC (CROSSMATCH)

## 2018-09-11 SURGERY — CLOSED REDUCTION, PELVIS, WITH PERCUTANEOUS FIXATION
Anesthesia: General | Site: Pelvis

## 2018-09-11 MED ORDER — CEFAZOLIN SODIUM-DEXTROSE 2-4 GM/100ML-% IV SOLN
2.0000 g | Freq: Three times a day (TID) | INTRAVENOUS | Status: AC
  Administered 2018-09-11 – 2018-09-12 (×3): 2 g via INTRAVENOUS
  Filled 2018-09-11 (×3): qty 100

## 2018-09-11 MED ORDER — SODIUM CHLORIDE 0.9% IV SOLUTION: Freq: Once | INTRAVENOUS | Status: DC

## 2018-09-11 MED ORDER — CHLORHEXIDINE GLUCONATE CLOTH 2 % EX PADS
6.0000 | MEDICATED_PAD | Freq: Every day | CUTANEOUS | Status: DC
  Administered 2018-09-11 – 2018-09-15 (×4): 6 via TOPICAL

## 2018-09-11 MED ORDER — CEFAZOLIN SODIUM-DEXTROSE 2-3 GM-%(50ML) IV SOLR
INTRAVENOUS | Status: DC | PRN
  Administered 2018-09-11: 2 g via INTRAVENOUS

## 2018-09-11 MED ORDER — MUPIROCIN 2 % EX OINT
1.0000 "application " | TOPICAL_OINTMENT | Freq: Two times a day (BID) | CUTANEOUS | Status: AC
  Administered 2018-09-11 – 2018-09-15 (×10): 1 via NASAL

## 2018-09-11 MED ORDER — ROCURONIUM BROMIDE 10 MG/ML (PF) SYRINGE
PREFILLED_SYRINGE | INTRAVENOUS | Status: DC | PRN
  Administered 2018-09-11 (×4): 50 mg via INTRAVENOUS

## 2018-09-11 MED ORDER — SODIUM CHLORIDE 0.9 % IV SOLN
INTRAVENOUS | Status: DC | PRN
  Administered 2018-09-11 – 2018-09-22 (×3): 250 mL via INTRAVENOUS

## 2018-09-11 MED ORDER — ONDANSETRON HCL 4 MG/2ML IJ SOLN
INTRAMUSCULAR | Status: DC | PRN
  Administered 2018-09-11: 4 mg via INTRAVENOUS

## 2018-09-11 MED ORDER — LACTATED RINGERS IV SOLN
INTRAVENOUS | Status: DC | PRN
  Administered 2018-09-11 (×2): via INTRAVENOUS

## 2018-09-11 MED ORDER — VANCOMYCIN HCL 1000 MG IV SOLR
INTRAVENOUS | Status: DC | PRN
  Administered 2018-09-11: 1000 mg via TOPICAL

## 2018-09-11 MED ORDER — 0.9 % SODIUM CHLORIDE (POUR BTL) OPTIME
TOPICAL | Status: DC | PRN
  Administered 2018-09-11: 1000 mL

## 2018-09-11 MED ORDER — SODIUM CHLORIDE 0.9 % IV SOLN
INTRAVENOUS | Status: DC | PRN
  Administered 2018-09-11: 13:00:00 via INTRAVENOUS

## 2018-09-11 MED ORDER — ALBUMIN HUMAN 5 % IV SOLN
INTRAVENOUS | Status: DC | PRN
  Administered 2018-09-11 (×3): via INTRAVENOUS

## 2018-09-11 MED ORDER — MUPIROCIN 2 % EX OINT
1.0000 "application " | TOPICAL_OINTMENT | Freq: Two times a day (BID) | CUTANEOUS | Status: DC
  Filled 2018-09-11: qty 22

## 2018-09-11 MED ORDER — ROCURONIUM BROMIDE 50 MG/5ML IV SOSY
PREFILLED_SYRINGE | INTRAVENOUS | Status: AC
  Filled 2018-09-11: qty 15

## 2018-09-11 MED ORDER — NICARDIPINE HCL IN NACL 20-0.86 MG/200ML-% IV SOLN
INTRAVENOUS | Status: AC
  Filled 2018-09-11: qty 200

## 2018-09-11 MED ORDER — SODIUM CHLORIDE 0.9 % IV SOLN: INTRAVENOUS | Status: DC | PRN

## 2018-09-11 MED ORDER — MIDAZOLAM HCL 2 MG/2ML IJ SOLN
INTRAMUSCULAR | Status: AC
  Filled 2018-09-11: qty 2

## 2018-09-11 MED ORDER — SODIUM CHLORIDE 0.9 % IV SOLN
INTRAVENOUS | Status: DC | PRN
  Administered 2018-09-11: 20 ug/min via INTRAVENOUS

## 2018-09-11 MED ORDER — MUPIROCIN 2 % EX OINT: 1.0000 "application " | TOPICAL_OINTMENT | Freq: Two times a day (BID) | CUTANEOUS | Status: DC

## 2018-09-11 MED ORDER — ROCURONIUM BROMIDE 50 MG/5ML IV SOSY
PREFILLED_SYRINGE | INTRAVENOUS | Status: AC
  Filled 2018-09-11: qty 5

## 2018-09-11 MED ORDER — MIDAZOLAM HCL 5 MG/5ML IJ SOLN
INTRAMUSCULAR | Status: DC | PRN
  Administered 2018-09-11: 2 mg via INTRAVENOUS

## 2018-09-11 MED ORDER — PHENYLEPHRINE 40 MCG/ML (10ML) SYRINGE FOR IV PUSH (FOR BLOOD PRESSURE SUPPORT)
PREFILLED_SYRINGE | INTRAVENOUS | Status: DC | PRN
  Administered 2018-09-11: 80 ug via INTRAVENOUS
  Administered 2018-09-11: 40 ug via INTRAVENOUS

## 2018-09-11 SURGICAL SUPPLY — 85 items
ADH SKN CLS APL DERMABOND .7 (GAUZE/BANDAGES/DRESSINGS) ×3
BANDAGE ACE 4X5 VEL STRL LF (GAUZE/BANDAGES/DRESSINGS) ×5 IMPLANT
BANDAGE ACE 6X5 VEL STRL LF (GAUZE/BANDAGES/DRESSINGS) ×14 IMPLANT
BIT DRILL CANN 4.5MM (BIT) ×2 IMPLANT
BIT DRILL CANN LRG QC 5X300 (BIT) ×3 IMPLANT
BIT DRILL SHORT 4.2 (BIT) ×1 IMPLANT
BLADE CLIPPER SURG (BLADE) IMPLANT
BLADE SURG 10 STRL SS (BLADE) ×10 IMPLANT
BLADE SURG 11 STRL SS (BLADE) ×5 IMPLANT
BNDG COHESIVE 4X5 TAN STRL (GAUZE/BANDAGES/DRESSINGS) ×5 IMPLANT
BNDG GAUZE ELAST 4 BULKY (GAUZE/BANDAGES/DRESSINGS) ×11 IMPLANT
BRUSH SCRUB SURG 4.25 DISP (MISCELLANEOUS) ×10 IMPLANT
CHLORAPREP W/TINT 26ML (MISCELLANEOUS) ×11 IMPLANT
COVER PERINEAL POST (MISCELLANEOUS) ×2 IMPLANT
COVER SURGICAL LIGHT HANDLE (MISCELLANEOUS) ×14 IMPLANT
COVER WAND RF STERILE (DRAPES) ×5 IMPLANT
DERMABOND ADVANCED (GAUZE/BANDAGES/DRESSINGS) ×2
DERMABOND ADVANCED .7 DNX12 (GAUZE/BANDAGES/DRESSINGS) ×3 IMPLANT
DRAPE C-ARM 42X72 X-RAY (DRAPES) ×13 IMPLANT
DRAPE C-ARMOR (DRAPES) ×5 IMPLANT
DRAPE HALF SHEET 40X57 (DRAPES) ×10 IMPLANT
DRAPE IMP U-DRAPE 54X76 (DRAPES) ×15 IMPLANT
DRAPE INCISE IOBAN 66X45 STRL (DRAPES) ×8 IMPLANT
DRAPE ORTHO SPLIT 77X108 STRL (DRAPES) ×10
DRAPE PROXIMA HALF (DRAPES) ×5 IMPLANT
DRAPE SURG 17X23 STRL (DRAPES) ×30 IMPLANT
DRAPE SURG ORHT 6 SPLT 77X108 (DRAPES) ×6 IMPLANT
DRAPE U-SHAPE 47X51 STRL (DRAPES) ×5 IMPLANT
DRAPE UNIVERSAL PACK (DRAPES) ×5 IMPLANT
DRILL BIT CANN 4.5MM (BIT) ×4
DRILL BIT SHORT 4.2 (BIT) ×5
DRSG ADAPTIC 3X8 NADH LF (GAUZE/BANDAGES/DRESSINGS) ×5 IMPLANT
DRSG MEPILEX BORDER 4X4 (GAUZE/BANDAGES/DRESSINGS) ×5 IMPLANT
DRSG MEPILEX BORDER 4X8 (GAUZE/BANDAGES/DRESSINGS) ×5 IMPLANT
ELECT REM PT RETURN 9FT ADLT (ELECTROSURGICAL) ×5
ELECTRODE REM PT RTRN 9FT ADLT (ELECTROSURGICAL) ×3 IMPLANT
GAUZE SPONGE 4X4 12PLY STRL (GAUZE/BANDAGES/DRESSINGS) ×5 IMPLANT
GLOVE BIO SURGEON STRL SZ7.5 (GLOVE) ×20 IMPLANT
GLOVE BIOGEL PI IND STRL 7.5 (GLOVE) ×3 IMPLANT
GLOVE BIOGEL PI INDICATOR 7.5 (GLOVE) ×2
GOWN STRL REUS W/ TWL LRG LVL3 (GOWN DISPOSABLE) ×6 IMPLANT
GOWN STRL REUS W/TWL LRG LVL3 (GOWN DISPOSABLE) ×10
GUIDEWIRE 2.0MM (WIRE) ×8 IMPLANT
GUIDEWIRE 3.2X400 (WIRE) ×4 IMPLANT
GUIDEWIRE THREADED 2.8 (WIRE) ×6 IMPLANT
GUIDEWIRE THREADED 2.8MM (WIRE) ×8 IMPLANT
KIT BASIN OR (CUSTOM PROCEDURE TRAY) ×5 IMPLANT
KIT TURNOVER KIT B (KITS) ×5 IMPLANT
MANIFOLD NEPTUNE II (INSTRUMENTS) ×8 IMPLANT
NAIL CANN FRN 10X400 LT (Nail) ×3 IMPLANT
NS IRRIG 1000ML POUR BTL (IV SOLUTION) ×5 IMPLANT
PACK GENERAL/GYN (CUSTOM PROCEDURE TRAY) ×5 IMPLANT
PACK TOTAL JOINT (CUSTOM PROCEDURE TRAY) ×5 IMPLANT
PAD ARMBOARD 7.5X6 YLW CONV (MISCELLANEOUS) ×10 IMPLANT
PENCIL FOOT CONTROL (ELECTRODE) IMPLANT
REAMER ROD DEEP FLUTE 2.5X950 (INSTRUMENTS) ×4 IMPLANT
SCREW 6.5MM W/T25 STARDRIVE (Screw) ×3 IMPLANT
SCREW BONE CANN 7.3X75 HEX (Screw) ×4 IMPLANT
SCREW BONE CANN 7.3X95MM F/TH (Screw) ×3 IMPLANT
SCREW CANN 6.5X80 TI F/TH (Screw) ×4 IMPLANT
SCREW CANN 6.5X85 TI F/TH (Screw) IMPLANT
SCREW CANN 7.3X140X32 (MISCELLANEOUS) ×4 IMPLANT
SCREW CANN THRD 7.3X32X155 (Screw) ×3 IMPLANT
SCREW LOCK STAR 5X48 (Screw) ×3 IMPLANT
SCREW LOCK STAR 5X54 (Screw) ×4 IMPLANT
SCREW RECON TI T25 6.5X80 (Screw) ×4 IMPLANT
SPONGE LAP 18X18 X RAY DECT (DISPOSABLE) IMPLANT
STAPLER VISISTAT 35W (STAPLE) ×5 IMPLANT
SUCTION FRAZIER HANDLE 10FR (MISCELLANEOUS) ×2
SUCTION TUBE FRAZIER 10FR DISP (MISCELLANEOUS) ×3 IMPLANT
SUT MNCRL AB 3-0 PS2 18 (SUTURE) ×5 IMPLANT
SUT MON AB 2-0 CT1 36 (SUTURE) ×5 IMPLANT
SUT VIC AB 0 CT1 27 (SUTURE)
SUT VIC AB 0 CT1 27XBRD ANBCTR (SUTURE) IMPLANT
SUT VIC AB 2-0 CT1 27 (SUTURE) ×10
SUT VIC AB 2-0 CT1 TAPERPNT 27 (SUTURE) ×6 IMPLANT
TOWEL OR 17X24 6PK STRL BLUE (TOWEL DISPOSABLE) ×5 IMPLANT
TOWEL OR 17X26 10 PK STRL BLUE (TOWEL DISPOSABLE) ×10 IMPLANT
TRAY FOLEY MTR SLVR 16FR STAT (SET/KITS/TRAYS/PACK) IMPLANT
TUBE CONNECTING 12'X1/4 (SUCTIONS) ×1
TUBE CONNECTING 12X1/4 (SUCTIONS) ×3 IMPLANT
WASHER 13.0MM (Orthopedic Implant) ×4 IMPLANT
WASHER FOR 5.0 SCREWS (Washer) ×9 IMPLANT
WATER STERILE IRR 1000ML POUR (IV SOLUTION) ×5 IMPLANT
YANKAUER SUCT BULB TIP NO VENT (SUCTIONS) ×3 IMPLANT

## 2018-09-11 NOTE — Progress Notes (Signed)
Orthopaedic Trauma Progress Note  Still no identity for patient and no family available. Appears stable overnight. No major issues.  Plan to proceed to OR today for fixation of pelvis and likely left proximal femur and left tibia if stable in OR. Plan to proceed with emergent consent since no family available.  Roby Lofts, MD [facility] [phone] (phone)
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
## 2018-09-11 NOTE — Transfer of Care (Signed)
Immediate Anesthesia Transfer of Care Note  Patient: Derek Moon  Procedure(s) Performed: ORIF PELVIC FRACTURE WITH PERCUTANEOUS SCREWS (N/A Pelvis) INTRAMEDULLARY (IM) NAIL INTERTROCHANTRIC (Left Leg Upper)  Patient Location: ICU  Anesthesia Type:General  Level of Consciousness: sedated and Patient remains intubated per anesthesia plan  Airway & Oxygen Therapy: Patient remains intubated per anesthesia plan and Patient placed on Ventilator (see vital sign flow sheet for setting)  Post-op Assessment: Report given to RN and Post -op Vital signs reviewed and stable  Post vital signs: Reviewed and stable  Last Vitals:  Vitals Value Taken Time  BP    Temp    Pulse    Resp    SpO2      Last Pain:  Vitals:   09/11/18 1000  TempSrc: Bladder  PainSc:          Complications: No apparent anesthesia complications

## 2018-09-11 NOTE — Anesthesia Preprocedure Evaluation (Signed)
Anesthesia Evaluation  Patient identified by MRN, date of birth, ID band Patient unresponsive    Reviewed: Patient's Chart, lab work & pertinent test results, Unable to perform ROS - Chart review onlyPreop documentation limited or incomplete due to emergent nature of procedure.  Airway Mallampati: Intubated       Dental no notable dental hx. (+) Teeth Intact, Dental Advisory Given   Pulmonary neg pulmonary ROS,    Pulmonary exam normal breath sounds clear to auscultation       Cardiovascular negative cardio ROS   Rhythm:Regular Rate:Normal     Neuro/Psych negative neurological ROS  negative psych ROS   GI/Hepatic negative GI ROS, Neg liver ROS,   Endo/Other  negative endocrine ROS  Renal/GU negative Renal ROS  negative genitourinary   Musculoskeletal   Abdominal   Peds  Hematology negative hematology ROS (+)   Anesthesia Other Findings   Reproductive/Obstetrics negative OB ROS                             Anesthesia Physical  Anesthesia Plan  ASA: III  Anesthesia Plan: General   Post-op Pain Management:    Induction: Intravenous  PONV Risk Score and Plan: 2 and Treatment may vary due to age or medical condition  Airway Management Planned: Oral ETT  Additional Equipment:   Intra-op Plan:   Post-operative Plan: Post-operative intubation/ventilation  Informed Consent: I have reviewed the patients History and Physical, chart, labs and discussed the procedure including the risks, benefits and alternatives for the proposed anesthesia with the patient or authorized representative who has indicated his/her understanding and acceptance.   Dental advisory given  Plan Discussed with: CRNA  Anesthesia Plan Comments:         Anesthesia Quick Evaluation

## 2018-09-11 NOTE — Progress Notes (Signed)
Critical ABG values RBV by Bartholomew Crews, RN at 1700 on 09/11/2018 by Gertie Fey, RRT.

## 2018-09-11 NOTE — Anesthesia Postprocedure Evaluation (Signed)
Anesthesia Post Note  Patient: Deunta Beneke  Procedure(s) Performed: ORIF PELVIC FRACTURE WITH PERCUTANEOUS SCREWS (N/A Pelvis) INTRAMEDULLARY (IM) NAIL INTERTROCHANTRIC (Left Leg Upper)     Patient location during evaluation: SICU Anesthesia Type: General Level of consciousness: sedated Pain management: pain level controlled Vital Signs Assessment: post-procedure vital signs reviewed and stable Respiratory status: patient remains intubated per anesthesia plan Cardiovascular status: stable Postop Assessment: no apparent nausea or vomiting Anesthetic complications: no    Last Vitals:  Vitals:   09/11/18 1100 09/11/18 1152  BP: 102/71   Pulse: 93   Resp: 19   Temp: 36.8 C   SpO2: 100% 100%    Last Pain:  Vitals:   09/11/18 1000  TempSrc: Bladder  PainSc:                  Calogero Geisen S
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
## 2018-09-11 NOTE — Progress Notes (Signed)
Patient ID: Derek Moon, male   DOB: 11/11/1877, 62 y.o.   MRN: [id number] Patient remains intubated and sedated however will awaken and follow commands. No evidence of CSF rhinorrhea  We'll continue to follow

## 2018-09-12 ENCOUNTER — Inpatient Hospital Stay (HOSPITAL_COMMUNITY): Payer: No Typology Code available for payment source

## 2018-09-12 DIAGNOSIS — I471 Supraventricular tachycardia: Secondary | ICD-10-CM

## 2018-09-12 LAB — PHOSPHORUS: PHOSPHORUS: 2.2 mg/dL — AB (ref 2.5–4.6)

## 2018-09-12 LAB — BASIC METABOLIC PANEL
ANION GAP: 3 — AB (ref 5–15)
BUN: 18 mg/dL (ref 8–23)
CHLORIDE: 115 mmol/L — AB (ref 98–111)
CO2: 23 mmol/L (ref 22–32)
Calcium: 7.5 mg/dL — ABNORMAL LOW (ref 8.9–10.3)
Creatinine, Ser: 0.85 mg/dL (ref 0.61–1.24)
GFR calc Af Amer: >60 mL/min (ref >60)
GFR calc non Af Amer: >60 mL/min (ref >60)
Glucose, Bld: 110 mg/dL — ABNORMAL HIGH (ref 70–99)
POTASSIUM: 3.4 mmol/L — AB (ref 3.5–5.1)
Sodium: 141 mmol/L (ref 135–145)

## 2018-09-12 LAB — BPAM PLATELET PHERESIS
BLOOD PRODUCT EXPIRATION DATE: 201911012359
Blood Product Expiration Date: 201911032359
ISSUE DATE / TIME: 201911011225
ISSUE DATE / TIME: 201911011529
UNIT TYPE AND RH: 7300
UNIT TYPE AND RH: 7300

## 2018-09-12 LAB — PREPARE PLATELET PHERESIS
Unit division: 0
Unit division: 0

## 2018-09-12 LAB — CBC WITH DIFFERENTIAL/PLATELET
Abs Immature Granulocytes: 0.14 K/uL — ABNORMAL HIGH (ref 0.00–0.07)
Basophils Absolute: 0.1 K/uL (ref 0.0–0.1)
Basophils Relative: 0 %
Eosinophils Absolute: 0.3 K/uL (ref 0.0–0.5)
Eosinophils Relative: 2 %
HCT: 21.8 % — ABNORMAL LOW (ref 39.0–52.0)
Hemoglobin: 7.1 g/dL — ABNORMAL LOW (ref 13.0–17.0)
Immature Granulocytes: 1 %
Lymphocytes Relative: 12 %
Lymphs Abs: 1.7 K/uL (ref 0.7–4.0)
MCH: 30 pg (ref 26.0–34.0)
MCHC: 32.6 g/dL (ref 30.0–36.0)
MCV: 92 fL (ref 80.0–100.0)
Monocytes Absolute: 0.9 K/uL (ref 0.1–1.0)
Monocytes Relative: 6 %
Neutro Abs: 10.8 K/uL — ABNORMAL HIGH (ref 1.7–7.7)
Neutrophils Relative %: 79 %
Platelets: 92 K/uL — ABNORMAL LOW (ref 150–400)
RBC: 2.37 MIL/uL — ABNORMAL LOW (ref 4.22–5.81)
RDW: 15.9 % — ABNORMAL HIGH (ref 11.5–15.5)
WBC: 13.9 K/uL — ABNORMAL HIGH (ref 4.0–10.5)
nRBC: 0 % (ref 0.0–0.2)

## 2018-09-12 LAB — MAGNESIUM: Magnesium: 2 mg/dL (ref 1.7–2.4)

## 2018-09-12 LAB — TROPONIN I
Troponin I: <0.03 ng/mL (ref <0.03)
Troponin I: <0.03 ng/mL (ref <0.03)

## 2018-09-12 LAB — GLUCOSE, CAPILLARY
Glucose-Capillary: 86 mg/dL (ref 70–99)
Glucose-Capillary: 97 mg/dL (ref 70–99)

## 2018-09-12 MED ORDER — POTASSIUM CHLORIDE 10 MEQ/100ML IV SOLN
10.0000 meq | INTRAVENOUS | Status: AC
  Administered 2018-09-12 (×4): 10 meq via INTRAVENOUS
  Filled 2018-09-12 (×3): qty 100

## 2018-09-12 MED ORDER — PRO-STAT SUGAR FREE PO LIQD
60.0000 mL | Freq: Every day | ORAL | Status: DC
  Administered 2018-09-12 – 2018-09-15 (×11): 60 mL
  Filled 2018-09-12 (×11): qty 60

## 2018-09-12 MED ORDER — PIVOT 1.5 CAL PO LIQD
1000.0000 mL | ORAL | Status: DC
  Administered 2018-09-12 – 2018-09-14 (×2): 1000 mL

## 2018-09-12 NOTE — Progress Notes (Signed)
Pt attempting to remove ETT. Able to remove mitt from R hand as his aline prevents a mitt on L. Requested to write. Unable to clearing write but was able to hold pin and try. Increased gtts for comfort. Will cont to monitor.

## 2018-09-12 NOTE — Progress Notes (Signed)
Patient tachycardia, heart rate greater than 130 sustaining. EKG completed and MD notified. MD will consult cardiology. Patient responsive and follow commands. RN will continue to monitor.

## 2018-09-12 NOTE — Progress Notes (Signed)
SPORTS MEDICINE AND JOINT REPLACEMENT  Georgena Spurling, MD    Laurier Nancy, PA-C [address] [place], [place], [place]  [address]                             [phone]   PROGRESS NOTE  Subjective:  Sedated and intubated, pain levels are controlled Objective: Vital signs in last 24 hours:    Patient Vitals for the past 24 hrs:  BP Temp Temp src Pulse Resp SpO2  09/12/18 0748 -- -- -- -- -- 100 %  09/12/18 0700 107/66 -- -- 89 20 100 %  09/12/18 0600 113/68 -- -- 90 20 100 %  09/12/18 0500 113/62 -- -- 93 20 100 %  09/12/18 0400 -- 98.9 F (37.2 C) Axillary -- -- --  09/12/18 0328 -- -- -- 96 (!) 21 100 %  09/12/18 0200 125/72 -- -- (!) 106 14 100 %  09/12/18 0100 122/63 -- -- (!) 112 (!) 24 96 %  09/12/18 0000 104/68 99 F (37.2 C) Axillary (!) 105 (!) 24 100 %  09/11/18 2340 -- -- -- -- -- 100 %  09/11/18 2300 107/64 -- -- 94 18 100 %  09/11/18 2200 108/66 -- -- 98 18 100 %  09/11/18 2100 105/67 -- -- (!) 105 18 98 %  09/11/18 2000 111/70 98.8 F (37.1 C) Axillary (!) 102 17 100 %  09/11/18 1932 -- -- -- (!) 101 (!) 25 100 %  09/11/18 1900 110/76 -- -- 98 17 100 %  09/11/18 1800 115/69 (!) 96.9 F (36.1 C) Axillary 96 20 98 %  09/11/18 1710 -- -- -- -- -- 97 %  09/11/18 1152 -- -- -- -- -- 100 %  09/11/18 1100 102/71 98.2 F (36.8 C) -- 93 19 100 %  09/11/18 1000 108/75 98.4 F (36.9 C) Bladder 95 19 99 %  09/11/18 0900 103/68 -- -- 92 (!) 22 98 %    @flow {1959:LAST@   Intake/Output from previous day:   11/01 0701 - 11/02 0700 In: 6450.3 [I.V.:4283.3] Out: 1360 [Urine:1010]   Intake/Output this shift:   No intake/output data recorded.   Intake/Output      11/01 0701 - 11/02 0700 11/02 0701 - 11/03 0700   I.V. (mL/kg) 4283.3 (37.8)    Blood 1117    IV Piggyback 1050    Total Intake(mL/kg) 6450.3 (56.9)    Urine (mL/kg/hr) 1010 (0.4)    Emesis/NG output     Blood 350    Total Output 1360    Net +5090.3            LABORATORY DATA: Recent Labs     09/09/18 1850  09/09/18 2228 09/09/18 2304 09/10/18 0500 09/10/18 1610 09/11/18 0500 09/11/18 1419 09/12/18 0501  WBC 23.2*  --  24.8*  --  16.9* 15.8* 16.8*  --  13.9*  HGB 13.1   < > 11.0* 9.2* 7.9* 9.5* 8.3* 8.8* 7.1*  HCT 40.8   < > 32.9* 27.0* 23.9* 28.1* 25.9* 26.0* 21.8*  PLT 299  --  124*  --  86* 71* 69*  --  92*   < > = values in this interval not displayed.   Recent Labs    09/09/18 1850 09/09/18 1912 09/09/18 2158 09/09/18 2304 09/10/18 0500 09/11/18 0500 09/11/18 1419 09/12/18 0501  NA 133* 137 140 139 137 139 144 PENDING  K 4.2 3.8 3.7 3.5 3.7 3.4* 4.0 PENDING  CL  105 105  --   --  110 113*  --  PENDING  CO2 20*  --   --   --  24 24  --  PENDING  BUN 9 9  --   --  16 18  --  18  CREATININE 1.00 1.00  --   --  1.12 0.78  --  0.85  GLUCOSE 133* 140*  --   --  137* 112* 124* 110*  CALCIUM 8.3*  --   --   --  7.3* 7.5*  --  7.5*   Lab Results  Component Value Date   INR 1.05 09/09/2018    Examination:   Wound Exam: clean, dry, intact   Drainage:  small amount bloody/Serous exudate    Assessment:    1 Day Post-Op  Procedure(s) (LRB): ORIF PELVIC FRACTURE WITH PERCUTANEOUS SCREWS (N/A) INTRAMEDULLARY (IM) NAIL INTERTROCHANTRIC (Left)  ADDITIONAL DIAGNOSIS:  Active Problems:   Femur fracture, right (HCC)   Pelvic fracture (HCC)     Plan: Patient is sedated and intubated, plan for return to OR on Monday    Guy Sandifer 09/12/2018, 8:11 AM
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
## 2018-09-12 NOTE — Progress Notes (Signed)
Follow up - Trauma and Critical Care  Patient Details:    Derek Moon is an 62 y.o. male.  Lines/tubes : Airway 7.5 mm (Active)  Secured at (cm) 25 cm 09/11/2018 11:52 AM  Measured From Lips 09/11/2018 11:52 AM  Secured Location Center 09/11/2018 11:52 AM  Secured By [employer] 09/11/2018 11:52 AM  Tube Holder Repositioned Yes 09/11/2018 11:52 AM  Cuff Pressure (cm H2O) 28 cm H2O 09/11/2018  7:53 AM  Site Condition Dry 09/11/2018 11:52 AM     CVC Double Lumen 09/09/18 Left Subclavian 16 cm (Active)  Indication for Insertion or Continuance of Line Prolonged intravenous therapies 09/10/2018  8:00 PM  Site Assessment Clean;Dry;Intact 09/10/2018  8:00 PM  Proximal Lumen Status Infusing 09/10/2018  8:00 PM  Distal Lumen Status Flushed;Saline locked 09/10/2018  8:00 PM  Dressing Type Transparent;Occlusive 09/10/2018  8:00 PM  Dressing Status Clean;Dry;Intact;Antimicrobial disc in place 09/10/2018  8:00 PM  Line Care Connections checked and tightened 09/10/2018  8:00 PM  Dressing Intervention Other (Comment) 09/10/2018  8:00 PM  Dressing Change Due 09/16/18 09/10/2018  8:00 PM     Arterial Line 09/09/18 Radial (Active)  Site Assessment Clean;Dry;Intact 09/10/2018  8:00 PM  Line Status Pulsatile blood flow 09/10/2018  8:00 PM  Art Line Waveform Appropriate;Square wave test performed 09/10/2018  8:00 PM  Art Line Interventions Zeroed and calibrated;Leveled;Connections checked and tightened 09/10/2018  8:00 PM  Color/Movement/Sensation Capillary refill less than 3 sec 09/10/2018  8:00 PM  Dressing Type Transparent;Occlusive 09/10/2018  8:00 PM  Dressing Status Clean;Dry;Intact;Antimicrobial disc in place 09/10/2018  8:00 PM  Interventions Other (Comment) 09/10/2018  8:00 PM  Dressing Change Due 09/16/18 09/10/2018  8:00 PM     NG/OG Tube (Active)  Site Assessment Dry;Intact 09/10/2018  8:00 PM  Ongoing Placement Verification No acute changes, not attributed to clinical  condition;No change in respiratory status 09/10/2018  8:00 PM  Status Suction-low intermittent 09/10/2018  8:00 PM  Amount of suction 118 mmHg 09/10/2018  8:00 PM  Drainage Appearance Brown 09/10/2018  8:00 PM  Output (mL) 330 mL 09/11/2018  6:00 AM     Urethral Catheter KENDRICK, RN Latex 16 Fr. (Active)  Indication for Insertion or Continuance of Catheter Unstable spinal/crush injuries 09/10/2018  8:00 PM  Site Assessment Intact;Dry 09/10/2018  8:00 PM  Catheter Maintenance Bag below level of bladder;Catheter secured;Drainage bag/tubing not touching floor;Seal intact;No dependent loops;Insertion date on drainage bag 09/10/2018  8:00 PM  Collection Container Standard drainage bag 09/10/2018  8:00 PM  Securement Method Securing device (Describe) 09/10/2018  8:00 PM  Output (mL) 200 mL 09/11/2018 11:00 AM    Microbiology/Sepsis markers: Results for orders placed or performed during the hospital encounter of 09/09/18  Surgical PCR screen     Status: Abnormal   Collection Time: 09/11/18 12:23 AM  Result Value Ref Range Status   MRSA, PCR NEGATIVE NEGATIVE Final   Staphylococcus aureus POSITIVE (A) NEGATIVE Final    Comment: (NOTE) The Xpert SA Assay (FDA approved for NASAL specimens in patients 69 years of age and older), is one component of a comprehensive surveillance program. It is not intended to diagnose infection nor to guide or monitor treatment. Performed at [facility], [address] [place], [place] [address]     Anti-infectives:  Anti-infectives (From admission, onward)   Start     Dose/Rate Route Frequency Ordered Stop   09/11/18 2000  ceFAZolin (ANCEF) IVPB 2g/100 mL premix     2 g 200 mL/hr  over 30 Minutes Intravenous Every 8 hours 09/11/18 1837 09/12/18 2159   09/11/18 1631  vancomycin (VANCOCIN) powder  Status:  Discontinued       As needed 09/11/18 1631 09/11/18 1640      Best Practice/Protocols:  VTE Prophylaxis: Mechanical GI Prophylaxis:  H2B Continous Sedation Holding chemical dvt ppx until hgb stable x24hrs  Consults: Treatment Team:  Roby Lofts, MD Donalee Citrin, MD    Events:  Subjective:    Overnight Issues: Patient taken to the OR yesterday for orthopedic procedures. CXR improved appearance; persistant R pleural effusion and atelectasis bilaterally  Objective:  Vital signs for last 24 hours: Temp:  [96.9 F (36.1 C)-99 F (37.2 C)] 98.5 F (36.9 C) (11/02 0800) Pulse Rate:  [89-112] 89 (11/02 0700) Resp:  [14-25] 20 (11/02 0700) BP: (102-125)/(62-76) 107/66 (11/02 0700) SpO2:  [96 %-100 %] 100 % (11/02 0748) Arterial Line BP: (111-147)/(36-62) 124/51 (11/02 0700) FiO2 (%):  [40 %] 40 % (11/02 0748)  Hemodynamic parameters for last 24 hours:    Intake/Output from previous day: 11/01 0701 - 11/02 0700 In: 6450.3 [I.V.:4283.3; Blood:1117; IV Piggyback:1050] Out: 1360 [Urine:1010; Blood:350]  Intake/Output this shift: Total I/O In: 257.3 [I.V.:257.3] Out: 50 [Urine:50]  Vent settings for last 24 hours: Vent Mode: PRVC FiO2 (%):  [40 %] 40 % Set Rate:  [20 bmp] 20 bmp Vt Set:  [580 mL] 580 mL PEEP:  [5 cmH20] 5 cmH20 Plateau Pressure:  [14 cmH20-18 cmH20] 15 cmH20  Physical Exam:  General: no respiratory distress Neuro: RASS -2 Resp: clear to auscultation bilaterally and CXR shows persistent right basilar effusion and bilateral atelectasis CVS: regular rate and rhythm, S1, S2 normal, no murmur, click, rub or gallop GI: Soft, nondisteded; TFs not running Extremities: Orthopedic devices attached bilaterally  Results for orders placed or performed during the hospital encounter of 09/09/18 (from the past 24 hour(s))  Prepare Pheresed Platelets     Status: None (Preliminary result)   Collection Time: 09/11/18 11:58 AM  Result Value Ref Range   Unit Number [id number]    Blood Component Type PLTPH LI2 PAS    Unit division 00    Status of Unit ISSUED    Transfusion Status      OK TO  TRANSFUSE Performed at [facility], [address], [place], [place] [address]   Prepare RBC     Status: None   Collection Time: 09/11/18 12:42 PM  Result Value Ref Range   Order Confirmation      ORDER PROCESSED BY BLOOD BANK Performed at [facility], [address] [place], [place] [address]   I-STAT 4, (NA,K, GLUC, HGB,HCT)     Status: Abnormal   Collection Time: 09/11/18  2:19 PM  Result Value Ref Range   Sodium 144 135 - 145 mmol/L   Potassium 4.0 3.5 - 5.1 mmol/L   Glucose, Bld 124 (H) 70 - 99 mg/dL   HCT 62.1 (L) 30.8 - 65.7 %   Hemoglobin 8.8 (L) 13.0 - 17.0 g/dL  I-STAT 3, arterial blood gas (G3+)     Status: Abnormal   Collection Time: 09/11/18  2:33 PM  Result Value Ref Range   pH, Arterial 7.202 (L) 7.350 - 7.450   pCO2 arterial 59.1 (H) 32.0 - 48.0 mmHg   pO2, Arterial 104.0 83.0 - 108.0 mmHg   Bicarbonate 23.6 20.0 - 28.0 mmol/L   TCO2 25 22 - 32 mmol/L   O2 Saturation 97.0 %   Acid-base  deficit 6.0 (H) 0.0 - 2.0 mmol/L   Patient temperature 35.8 C    Sample type ARTERIAL    Comment NOTIFIED PHYSICIAN   I-STAT 3, arterial blood gas (G3+)     Status: Abnormal   Collection Time: 09/11/18  3:14 PM  Result Value Ref Range   pH, Arterial 7.282 (L) 7.350 - 7.450   pCO2 arterial 49.6 (H) 32.0 - 48.0 mmHg   pO2, Arterial 106.0 83.0 - 108.0 mmHg   Bicarbonate 23.8 20.0 - 28.0 mmol/L   TCO2 25 22 - 32 mmol/L   O2 Saturation 98.0 %   Acid-base deficit 3.0 (H) 0.0 - 2.0 mmol/L   Patient temperature 35.6 C    Sample type ARTERIAL   Prepare Pheresed Platelets     Status: None (Preliminary result)   Collection Time: 09/11/18  3:24 PM  Result Value Ref Range   Unit Number [id number]    Blood Component Type PLTP LR2 PAS    Unit division 00    Status of Unit ISSUED    Transfusion Status      OK TO TRANSFUSE Performed at [facility], [address], [place], [place] [address]   CBC with Differential/Platelet     Status: Abnormal   Collection  Time: 09/12/18  5:01 AM  Result Value Ref Range   WBC 13.9 (H) 4.0 - 10.5 K/uL   RBC 2.37 (L) 4.22 - 5.81 MIL/uL   Hemoglobin 7.1 (L) 13.0 - 17.0 g/dL   HCT 44.0 (L) 10.2 - 72.5 %   MCV 92.0 80.0 - 100.0 fL   MCH 30.0 26.0 - 34.0 pg   MCHC 32.6 30.0 - 36.0 g/dL   RDW 36.6 (H) 44.0 - 34.7 %   Platelets 92 (L) 150 - 400 K/uL   nRBC 0.0 0.0 - 0.2 %   Neutrophils Relative % 79 %   Neutro Abs 10.8 (H) 1.7 - 7.7 K/uL   Lymphocytes Relative 12 %   Lymphs Abs 1.7 0.7 - 4.0 K/uL   Monocytes Relative 6 %   Monocytes Absolute 0.9 0.1 - 1.0 K/uL   Eosinophils Relative 2 %   Eosinophils Absolute 0.3 0.0 - 0.5 K/uL   Basophils Relative 0 %   Basophils Absolute 0.1 0.0 - 0.1 K/uL   Immature Granulocytes 1 %   Abs Immature Granulocytes 0.14 (H) 0.00 - 0.07 K/uL  Basic metabolic panel     Status: Abnormal   Collection Time: 09/12/18  5:01 AM  Result Value Ref Range   Sodium 141 135 - 145 mmol/L   Potassium 3.4 (L) 3.5 - 5.1 mmol/L   Chloride 115 (H) 98 - 111 mmol/L   CO2 23 22 - 32 mmol/L   Glucose, Bld 110 (H) 70 - 99 mg/dL   BUN 18 8 - 23 mg/dL   Creatinine, Ser 4.25 0.61 - 1.24 mg/dL   Calcium 7.5 (L) 8.9 - 10.3 mg/dL   GFR calc non Af Amer >60 >60 mL/min   GFR calc Af Amer >60 >60 mL/min   Anion gap 3 (L) 5 - 15     Assessment/Plan:   NEURO  Altered Mental Status:  sedation   Plan: Monitor  PULM  Atelectasis/collapse (focal and bibasilar effusions and atelectasis)   Plan: Weaning trials daily  CARDIO  Sinus Tachycardia - resolved   Plan: Monitor  RENAL  Adequate uop now - previously had issues   Plan: Monitor UOP  GI  No specific issues   Plan: CPM; tube  feeds today  ID  No known infectious sources   Plan: CPM  HEME  Anemia acute blood loss anemia) Thrombocytopenia (consumptive)   Plan: Plt count improving - receieved plt and 2 prbc in OR yesterday  ENDO No specific issues.   Plan:  CPM  Global Issues  Continue to manage all his orthopedic issues; monitor.   Frontal sinus fracture and orbit fractures stable. Attempting to liberate from ventilator   LOS: 3 days   Additional comments:I reviewed the patient's new clinical lab test results. cbc/bmet and I reviewed the patients new imaging test results. cxr  Critical Care Total Time*: 30 Minutes  Andria Meuse 09/12/2018  *Care during the described time interval was provided by me and/or other providers on the critical care team.  I have reviewed this patient's available data, including medical history, events of note, physical examination and test results as part of my evaluation.

## 2018-09-13 ENCOUNTER — Inpatient Hospital Stay (HOSPITAL_COMMUNITY): Payer: No Typology Code available for payment source

## 2018-09-13 LAB — GLUCOSE, CAPILLARY
GLUCOSE-CAPILLARY: 108 mg/dL — AB (ref 70–99)
GLUCOSE-CAPILLARY: 107 mg/dL — AB (ref 70–99)
Glucose-Capillary: 108 mg/dL — ABNORMAL HIGH (ref 70–99)
Glucose-Capillary: 101 mg/dL — ABNORMAL HIGH (ref 70–99)

## 2018-09-13 LAB — PHOSPHORUS
PHOSPHORUS: 1.7 mg/dL — AB (ref 2.5–4.6)
Phosphorus: 1.8 mg/dL — ABNORMAL LOW (ref 2.5–4.6)

## 2018-09-13 LAB — CBC WITH DIFFERENTIAL/PLATELET
Abs Immature Granulocytes: 0.14 10*3/uL — ABNORMAL HIGH (ref 0.00–0.07)
Basophils Absolute: 0.1 10*3/uL (ref 0.0–0.1)
Basophils Relative: 1 %
EOS ABS: 0.6 10*3/uL — AB (ref 0.0–0.5)
Eosinophils Relative: 4 %
HEMATOCRIT: 20.3 % — AB (ref 39.0–52.0)
Hemoglobin: 6.2 g/dL — CL (ref 13.0–17.0)
IMMATURE GRANULOCYTES: 1 %
LYMPHS ABS: 2.2 10*3/uL (ref 0.7–4.0)
Lymphocytes Relative: 17 %
MCH: 29.2 pg (ref 26.0–34.0)
MCHC: 30.5 g/dL (ref 30.0–36.0)
MCV: 95.8 fL (ref 80.0–100.0)
MONOS PCT: 8 %
Monocytes Absolute: 1.1 10*3/uL — ABNORMAL HIGH (ref 0.1–1.0)
NEUTROS PCT: 69 %
Neutro Abs: 9.1 10*3/uL — ABNORMAL HIGH (ref 1.7–7.7)
Platelets: 112 10*3/uL — ABNORMAL LOW (ref 150–400)
RBC: 2.12 MIL/uL — ABNORMAL LOW (ref 4.22–5.81)
RDW: 16.1 % — AB (ref 11.5–15.5)
WBC: 13.1 10*3/uL — ABNORMAL HIGH (ref 4.0–10.5)
nRBC: 0.2 % (ref 0.0–0.2)

## 2018-09-13 LAB — PREPARE RBC (CROSSMATCH)

## 2018-09-13 LAB — TRIGLYCERIDES: TRIGLYCERIDES: 174 mg/dL — AB (ref <150)

## 2018-09-13 LAB — BASIC METABOLIC PANEL
ANION GAP: 3 — AB (ref 5–15)
BUN: 25 mg/dL — ABNORMAL HIGH (ref 8–23)
CHLORIDE: 120 mmol/L — AB (ref 98–111)
CO2: 23 mmol/L (ref 22–32)
Calcium: 7.2 mg/dL — ABNORMAL LOW (ref 8.9–10.3)
Creatinine, Ser: 0.75 mg/dL (ref 0.61–1.24)
GFR calc Af Amer: >60 mL/min (ref >60)
GFR calc non Af Amer: >60 mL/min (ref >60)
Glucose, Bld: 104 mg/dL — ABNORMAL HIGH (ref 70–99)
Potassium: 3.4 mmol/L — ABNORMAL LOW (ref 3.5–5.1)
Sodium: 146 mmol/L — ABNORMAL HIGH (ref 135–145)

## 2018-09-13 LAB — HEMOGLOBIN AND HEMATOCRIT, BLOOD
HEMATOCRIT: 27 % — AB (ref 39.0–52.0)
HEMOGLOBIN: 8.7 g/dL — AB (ref 13.0–17.0)

## 2018-09-13 LAB — MAGNESIUM
Magnesium: 1.9 mg/dL (ref 1.7–2.4)
Magnesium: 2 mg/dL (ref 1.7–2.4)

## 2018-09-13 LAB — TROPONIN I: Troponin I: <0.03 ng/mL (ref <0.03)

## 2018-09-13 MED ORDER — SODIUM CHLORIDE 0.9% IV SOLUTION
Freq: Once | INTRAVENOUS | Status: AC
  Administered 2018-09-13: 13:00:00 via INTRAVENOUS

## 2018-09-13 MED ORDER — POTASSIUM CHLORIDE 10 MEQ/100ML IV SOLN
10.0000 meq | INTRAVENOUS | Status: AC
  Administered 2018-09-13 (×4): 10 meq via INTRAVENOUS
  Filled 2018-09-13 (×4): qty 100

## 2018-09-13 MED ORDER — CEFAZOLIN SODIUM-DEXTROSE 2-4 GM/100ML-% IV SOLN
2.0000 g | INTRAVENOUS | Status: AC
  Administered 2018-09-14: 2 g via INTRAVENOUS

## 2018-09-13 MED ORDER — KCL IN DEXTROSE-NACL 20-5-0.45 MEQ/L-%-% IV SOLN
INTRAVENOUS | Status: DC
  Administered 2018-09-13 – 2018-09-18 (×9): via INTRAVENOUS
  Filled 2018-09-13 (×10): qty 1000

## 2018-09-13 MED ORDER — WHITE PETROLATUM EX OINT
TOPICAL_OINTMENT | CUTANEOUS | Status: AC
  Filled 2018-09-13: qty 28.35
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
## 2018-09-13 NOTE — Progress Notes (Signed)
SPORTS MEDICINE AND JOINT REPLACEMENT  Georgena Spurling, MD    Laurier Nancy, PA-C [address] [place], [place], [place]  [address]                             [phone]   PROGRESS NOTE   Vital signs in last 24 hours:    Patient Vitals for the past 24 hrs:  BP Temp Temp src Pulse Resp SpO2 Weight  09/13/18 0805 -- -- -- -- -- 100 % --  09/13/18 0700 94/63 -- -- 85 19 100 % --  09/13/18 0600 (!) 100/55 -- -- 87 18 100 % --  09/13/18 0500 (!) 96/57 -- -- 86 19 100 % 111.2 kg  09/13/18 0400 (!) 108/59 -- -- 86 20 100 % --  09/13/18 0327 -- -- -- -- -- 100 % --  09/13/18 0300 (!) 102/57 -- -- 85 20 100 % --  09/13/18 0100 -- -- -- 98 13 100 % --  09/13/18 0033 -- -- -- -- -- 100 % --  09/13/18 0000 111/68 -- -- 93 (!) 9 100 % --  09/12/18 2300 106/62 99 F (37.2 C) Axillary 95 (!) 7 100 % --  09/12/18 2200 110/65 -- -- 93 10 100 % --  09/12/18 2100 110/68 -- -- 95 (!) 0 100 % --  09/12/18 2003 -- -- -- -- -- 100 % --  09/12/18 2000 112/69 98.6 F (37 C) Axillary 92 11 100 % --  09/12/18 1900 106/61 -- -- 97 10 100 % --  09/12/18 1800 112/61 -- -- (!) 106 13 100 % --  09/12/18 1700 113/62 -- -- 99 (!) 21 99 % --  09/12/18 1600 108/61 -- -- (!) 109 19 100 % --  09/12/18 1558 -- -- -- -- -- 94 % --  09/12/18 1500 128/81 -- -- (!) 120 18 98 % --  09/12/18 1400 116/65 -- -- 90 17 100 % --  09/12/18 1300 110/67 -- -- 94 10 99 % --  09/12/18 1200 (!) 97/59 98.6 F (37 C) Axillary 89 18 97 % --  09/12/18 1132 -- -- -- -- -- 95 % --  09/12/18 1100 118/71 -- -- 98 20 100 % --  09/12/18 1000 115/65 -- -- 89 20 100 % --  09/12/18 0900 137/77 -- -- 97 (!) 21 100 % --    @flow {1959:LAST@   Intake/Output from previous day:   11/02 0701 - 11/03 0700 In: 4908.3 [I.V.:4333.9] Out: 720 [Urine:720]   Intake/Output this shift:   No intake/output data recorded.   Intake/Output      11/02 0701 - 11/03 0700 11/03 0701 - 11/04 0700   I.V. (mL/kg) 4333.9 (39)    Blood     NG/GT 73.2     IV Piggyback 501.3    Total Intake(mL/kg) 4908.3 (44.1)    Urine (mL/kg/hr) 720 (0.3)    Blood     Total Output 720    Net +4188.3            LABORATORY DATA: Recent Labs    09/09/18 1850  09/09/18 2228 09/09/18 2304 09/10/18 0500 09/10/18 1610 09/11/18 0500 09/11/18 1419 09/12/18 0501 09/13/18 0429  WBC 23.2*  --  24.8*  --  16.9* 15.8* 16.8*  --  13.9* 13.1*  HGB 13.1   < > 11.0* 9.2* 7.9* 9.5* 8.3* 8.8* 7.1* 6.2*  HCT 40.8   < > 32.9* 27.0* 23.9*  28.1* 25.9* 26.0* 21.8* 20.3*  PLT 299  --  124*  --  86* 71* 69*  --  92* 112*   < > = values in this interval not displayed.   Recent Labs    09/09/18 1850 09/09/18 1912 09/09/18 2158 09/09/18 2304 09/10/18 0500 09/11/18 0500 09/11/18 1419 09/12/18 0501 09/13/18 0429  NA 133* 137 140 139 137 139 144 141 146*  K 4.2 3.8 3.7 3.5 3.7 3.4* 4.0 3.4* 3.4*  CL 105 105  --   --  110 113*  --  115* 120*  CO2 20*  --   --   --  24 24  --  23 23  BUN 9 9  --   --  16 18  --  18 25*  CREATININE 1.00 1.00  --   --  1.12 0.78  --  0.85 0.75  GLUCOSE 133* 140*  --   --  137* 112* 124* 110* 104*  CALCIUM 8.3*  --   --   --  7.3* 7.5*  --  7.5* 7.2*   Lab Results  Component Value Date   INR 1.05 09/09/2018    Examination:  Awake, alert  Wound - small amount of bloody/serous exudate  Assessment:    2 Days Post-Op  Procedure(s) (LRB): ORIF PELVIC FRACTURE WITH PERCUTANEOUS SCREWS (N/A) INTRAMEDULLARY (IM) NAIL INTERTROCHANTRIC (Left)  ADDITIONAL DIAGNOSIS:  Active Problems:   Femur fracture, right (HCC)   Pelvic fracture (HCC)     Plan:  Plan for return to OR tomorrow Guy Sandifer 09/13/2018, 8:18 AM
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
## 2018-09-13 NOTE — Progress Notes (Signed)
Spoke with Dr. Jena Gauss regarding consent for procedure order. Pt has stated he does not wish for [place] to notify his mother for consent during morning WUA. Sedation resumed later d/t agitation.  Dr. Jena Gauss will work with trauma Md tomorrow to determine if pt would like to have his mother aware of his situation and give consent for him in the future. Will have unsigned  consent ready in chart as requested.

## 2018-09-13 NOTE — Progress Notes (Signed)
Orthopaedic trauma note:  Plan to proceed with intramedullary nailing of left tibia and open reduction internal fixation of right distal femur tomorrow. Please hold tube feeds after midnight. Will need monitoring of his hgb as was 6.2 this AM, receiving PRBCs. Will evaluate in AM for appropriateness for returning to OR.  Roby Lofts, MD [facility] [phone] (phone)

## 2018-09-14 ENCOUNTER — Inpatient Hospital Stay (HOSPITAL_COMMUNITY): Payer: No Typology Code available for payment source

## 2018-09-14 ENCOUNTER — Inpatient Hospital Stay (HOSPITAL_COMMUNITY): Payer: No Typology Code available for payment source | Admitting: Certified Registered Nurse Anesthetist

## 2018-09-14 ENCOUNTER — Encounter (HOSPITAL_COMMUNITY): Admission: EM | Disposition: A | Discharge status: Skilled Nursing Facility | Payer: Self-pay | Source: Home / Self Care

## 2018-09-14 HISTORY — PX: ORIF FEMUR FRACTURE: SHX2119

## 2018-09-14 HISTORY — PX: TIBIA IM NAIL INSERTION: SHX2516

## 2018-09-14 LAB — TYPE AND SCREEN
ABO/RH(D): O POS
Antibody Screen: NEGATIVE
UNIT DIVISION: 0
UNIT DIVISION: 0
UNIT DIVISION: 0
UNIT DIVISION: 0
UNIT DIVISION: 0
UNIT DIVISION: 0
UNIT DIVISION: 0
UNIT DIVISION: 0
UNIT DIVISION: 0
UNIT DIVISION: 0
Unit division: 0
Unit division: 0
Unit division: 0
Unit division: 0
Unit division: 0
Unit division: 0
Unit division: 0
Unit division: 0
Unit division: 0
Unit division: 0
Unit division: 0
Unit division: 0

## 2018-09-14 LAB — POCT I-STAT 7, (LYTES, BLD GAS, ICA,H+H)
ACID-BASE DEFICIT: 3 mmol/L — AB (ref 0.0–2.0)
Acid-base deficit: 4 mmol/L — ABNORMAL HIGH (ref 0.0–2.0)
BICARBONATE: 24.2 mmol/L (ref 20.0–28.0)
BICARBONATE: 22.7 mmol/L (ref 20.0–28.0)
CALCIUM ION: 1.19 mmol/L (ref 1.15–1.40)
CALCIUM ION: 1.17 mmol/L (ref 1.15–1.40)
HCT: 27 % — ABNORMAL LOW (ref 39.0–52.0)
HCT: 26 % — ABNORMAL LOW (ref 39.0–52.0)
Hemoglobin: 9.2 g/dL — ABNORMAL LOW (ref 13.0–17.0)
Hemoglobin: 8.8 g/dL — ABNORMAL LOW (ref 13.0–17.0)
O2 SAT: 96 %
O2 Saturation: 93 %
PCO2 ART: 46.8 mmHg (ref 32.0–48.0)
PH ART: 7.278 — AB (ref 7.350–7.450)
PO2 ART: 87 mmHg (ref 83.0–108.0)
PO2 ART: 76 mmHg — AB (ref 83.0–108.0)
POTASSIUM: 4 mmol/L (ref 3.5–5.1)
Potassium: 4.1 mmol/L (ref 3.5–5.1)
SODIUM: 148 mmol/L — AB (ref 135–145)
Sodium: 147 mmol/L — ABNORMAL HIGH (ref 135–145)
TCO2: 26 mmol/L (ref 22–32)
TCO2: 24 mmol/L (ref 22–32)
pCO2 arterial: 51.2 mmHg — ABNORMAL HIGH (ref 32.0–48.0)
pH, Arterial: 7.295 — ABNORMAL LOW (ref 7.350–7.450)

## 2018-09-14 LAB — BPAM RBC
BLOOD PRODUCT EXPIRATION DATE: 201911062359
BLOOD PRODUCT EXPIRATION DATE: 201912012359
BLOOD PRODUCT EXPIRATION DATE: 201912012359
BLOOD PRODUCT EXPIRATION DATE: 201912012359
BLOOD PRODUCT EXPIRATION DATE: 201912012359
BLOOD PRODUCT EXPIRATION DATE: 201912012359
BLOOD PRODUCT EXPIRATION DATE: 201912012359
BLOOD PRODUCT EXPIRATION DATE: 201912012359
BLOOD PRODUCT EXPIRATION DATE: 201912012359
BLOOD PRODUCT EXPIRATION DATE: 201912012359
Blood Product Expiration Date: 201911052359
Blood Product Expiration Date: 201911162359
Blood Product Expiration Date: 201911222359
Blood Product Expiration Date: 201911222359
Blood Product Expiration Date: 201912012359
Blood Product Expiration Date: 201912012359
Blood Product Expiration Date: 201912012359
Blood Product Expiration Date: 201912012359
Blood Product Expiration Date: 201912012359
Blood Product Expiration Date: 201912012359
Blood Product Expiration Date: 201912012359
Blood Product Expiration Date: 201912012359
ISSUE DATE / TIME: 201910301848
ISSUE DATE / TIME: 201910301900
ISSUE DATE / TIME: 201910301910
ISSUE DATE / TIME: 201910301920
ISSUE DATE / TIME: 201910301952
ISSUE DATE / TIME: 201910310922
ISSUE DATE / TIME: 201910311949
ISSUE DATE / TIME: 201911011250
ISSUE DATE / TIME: 201911011250
ISSUE DATE / TIME: 201911012247
ISSUE DATE / TIME: 201911030746
ISSUE DATE / TIME: 201911031511
ISSUE DATE / TIME: 201910301848
ISSUE DATE / TIME: 201910301900
ISSUE DATE / TIME: 201910301910
ISSUE DATE / TIME: 201910301920
ISSUE DATE / TIME: 201910311127
ISSUE DATE / TIME: 201911011538
ISSUE DATE / TIME: 201911011538
ISSUE DATE / TIME: 201911030847
ISSUE DATE / TIME: 201911031655
UNIT TYPE AND RH: 5100
UNIT TYPE AND RH: 5100
UNIT TYPE AND RH: 5100
UNIT TYPE AND RH: 5100
UNIT TYPE AND RH: 5100
Unit Type and Rh: 5100
Unit Type and Rh: 5100
Unit Type and Rh: 5100
Unit Type and Rh: 5100
Unit Type and Rh: 5100
Unit Type and Rh: 5100
Unit Type and Rh: 5100
Unit Type and Rh: 5100
Unit Type and Rh: 5100
Unit Type and Rh: 5100
Unit Type and Rh: 5100
Unit Type and Rh: 5100
Unit Type and Rh: 5100
Unit Type and Rh: 5100
Unit Type and Rh: 5100
Unit Type and Rh: 9500
Unit Type and Rh: 9500

## 2018-09-14 LAB — POCT I-STAT 3, ART BLOOD GAS (G3+)
ACID-BASE DEFICIT: 4 mmol/L — AB (ref 0.0–2.0)
Bicarbonate: 22.8 mmol/L (ref 20.0–28.0)
O2 Saturation: 86 %
PCO2 ART: 49.4 mmHg — AB (ref 32.0–48.0)
PH ART: 7.269 — AB (ref 7.350–7.450)
PO2 ART: 57 mmHg — AB (ref 83.0–108.0)
Patient temperature: 97.7
TCO2: 24 mmol/L (ref 22–32)

## 2018-09-14 LAB — GLUCOSE, CAPILLARY
GLUCOSE-CAPILLARY: 117 mg/dL — AB (ref 70–99)
GLUCOSE-CAPILLARY: 96 mg/dL (ref 70–99)
Glucose-Capillary: 101 mg/dL — ABNORMAL HIGH (ref 70–99)
Glucose-Capillary: 128 mg/dL — ABNORMAL HIGH (ref 70–99)
Glucose-Capillary: 99 mg/dL (ref 70–99)

## 2018-09-14 LAB — PHOSPHORUS: PHOSPHORUS: 1.9 mg/dL — AB (ref 2.5–4.6)

## 2018-09-14 LAB — POCT I-STAT 4, (NA,K, GLUC, HGB,HCT)
Glucose, Bld: 103 mg/dL — ABNORMAL HIGH (ref 70–99)
HCT: 22 % — ABNORMAL LOW (ref 39.0–52.0)
HEMOGLOBIN: 7.5 g/dL — AB (ref 13.0–17.0)
POTASSIUM: 3.9 mmol/L (ref 3.5–5.1)
SODIUM: 149 mmol/L — AB (ref 135–145)

## 2018-09-14 LAB — PREPARE RBC (CROSSMATCH)

## 2018-09-14 LAB — CBC WITH DIFFERENTIAL/PLATELET
Abs Immature Granulocytes: 0.46 10*3/uL — ABNORMAL HIGH (ref 0.00–0.07)
BASOS ABS: 0.1 10*3/uL (ref 0.0–0.1)
BASOS PCT: 1 %
EOS ABS: 0.5 10*3/uL (ref 0.0–0.5)
Eosinophils Relative: 4 %
HCT: 26.3 % — ABNORMAL LOW (ref 39.0–52.0)
HEMOGLOBIN: 8.3 g/dL — AB (ref 13.0–17.0)
Immature Granulocytes: 4 %
LYMPHS PCT: 18 %
Lymphs Abs: 2.3 10*3/uL (ref 0.7–4.0)
MCH: 30 pg (ref 26.0–34.0)
MCHC: 31.6 g/dL (ref 30.0–36.0)
MCV: 94.9 fL (ref 80.0–100.0)
Monocytes Absolute: 1.4 10*3/uL — ABNORMAL HIGH (ref 0.1–1.0)
Monocytes Relative: 11 %
NRBC: 0 % (ref 0.0–0.2)
Neutro Abs: 8.4 10*3/uL — ABNORMAL HIGH (ref 1.7–7.7)
Neutrophils Relative %: 62 %
PLATELETS: 119 10*3/uL — AB (ref 150–400)
RBC: 2.77 MIL/uL — AB (ref 4.22–5.81)
RDW: 16.4 % — ABNORMAL HIGH (ref 11.5–15.5)
WBC: 13.1 10*3/uL — AB (ref 4.0–10.5)

## 2018-09-14 LAB — BASIC METABOLIC PANEL
Anion gap: 4 — ABNORMAL LOW (ref 5–15)
BUN: 30 mg/dL — ABNORMAL HIGH (ref 8–23)
CHLORIDE: 117 mmol/L — AB (ref 98–111)
CO2: 24 mmol/L (ref 22–32)
CREATININE: 0.59 mg/dL — AB (ref 0.61–1.24)
Calcium: 7.6 mg/dL — ABNORMAL LOW (ref 8.9–10.3)
GFR calc non Af Amer: >60 mL/min (ref >60)
Glucose, Bld: 112 mg/dL — ABNORMAL HIGH (ref 70–99)
Potassium: 3.7 mmol/L (ref 3.5–5.1)
SODIUM: 145 mmol/L (ref 135–145)

## 2018-09-14 LAB — MAGNESIUM: Magnesium: 2.1 mg/dL (ref 1.7–2.4)

## 2018-09-14 SURGERY — INSERTION, INTRAMEDULLARY ROD, TIBIA
Anesthesia: General | Site: Leg Upper | Laterality: Right

## 2018-09-14 MED ORDER — ROCURONIUM BROMIDE 10 MG/ML (PF) SYRINGE
PREFILLED_SYRINGE | INTRAVENOUS | Status: DC | PRN
  Administered 2018-09-14 (×3): 50 mg via INTRAVENOUS

## 2018-09-14 MED ORDER — PROPOFOL 10 MG/ML IV BOLUS
INTRAVENOUS | Status: DC | PRN
  Administered 2018-09-14: 50 mg via INTRAVENOUS

## 2018-09-14 MED ORDER — PROPOFOL 1000 MG/100ML IV EMUL
INTRAVENOUS | Status: AC
  Filled 2018-09-14: qty 100

## 2018-09-14 MED ORDER — PHENYLEPHRINE 40 MCG/ML (10ML) SYRINGE FOR IV PUSH (FOR BLOOD PRESSURE SUPPORT)
PREFILLED_SYRINGE | INTRAVENOUS | Status: AC
  Filled 2018-09-14: qty 10

## 2018-09-14 MED ORDER — FENTANYL CITRATE (PF) 250 MCG/5ML IJ SOLN
INTRAMUSCULAR | Status: DC | PRN
  Administered 2018-09-14 (×5): 50 ug via INTRAVENOUS

## 2018-09-14 MED ORDER — CEFAZOLIN SODIUM 1 G IJ SOLR
INTRAMUSCULAR | Status: AC
  Filled 2018-09-14: qty 40

## 2018-09-14 MED ORDER — ALBUMIN HUMAN 5 % IV SOLN
INTRAVENOUS | Status: DC | PRN
  Administered 2018-09-14: 13:00:00 via INTRAVENOUS

## 2018-09-14 MED ORDER — ONDANSETRON HCL 4 MG/2ML IJ SOLN
INTRAMUSCULAR | Status: DC | PRN
  Administered 2018-09-14: 4 mg via INTRAVENOUS

## 2018-09-14 MED ORDER — MIDAZOLAM HCL 2 MG/2ML IJ SOLN
INTRAMUSCULAR | Status: AC
  Filled 2018-09-14: qty 2

## 2018-09-14 MED ORDER — SODIUM CHLORIDE 0.9% IV SOLUTION: Freq: Once | INTRAVENOUS | Status: DC

## 2018-09-14 MED ORDER — DEXAMETHASONE SODIUM PHOSPHATE 10 MG/ML IJ SOLN
INTRAMUSCULAR | Status: DC | PRN
  Administered 2018-09-14: 5 mg via INTRAVENOUS

## 2018-09-14 MED ORDER — TOBRAMYCIN SULFATE 1.2 G IJ SOLR
INTRAMUSCULAR | Status: AC
  Filled 2018-09-14: qty 1.2

## 2018-09-14 MED ORDER — FENTANYL CITRATE (PF) 250 MCG/5ML IJ SOLN
INTRAMUSCULAR | Status: AC
  Filled 2018-09-14: qty 5

## 2018-09-14 MED ORDER — VANCOMYCIN HCL 1000 MG IV SOLR
INTRAVENOUS | Status: AC
  Filled 2018-09-14: qty 2000

## 2018-09-14 MED ORDER — PROPOFOL 10 MG/ML IV BOLUS
INTRAVENOUS | Status: AC
  Filled 2018-09-14: qty 20

## 2018-09-14 MED ORDER — BACITRACIN ZINC 500 UNIT/GM EX OINT
TOPICAL_OINTMENT | CUTANEOUS | Status: AC
  Filled 2018-09-14: qty 28.35

## 2018-09-14 MED ORDER — 0.9 % SODIUM CHLORIDE (POUR BTL) OPTIME
TOPICAL | Status: DC | PRN
  Administered 2018-09-14: 1000 mL

## 2018-09-14 MED ORDER — MIDAZOLAM HCL 2 MG/2ML IJ SOLN
INTRAMUSCULAR | Status: DC | PRN
  Administered 2018-09-14: 2 mg via INTRAVENOUS

## 2018-09-14 MED ORDER — ONDANSETRON HCL 4 MG/2ML IJ SOLN
INTRAMUSCULAR | Status: AC
  Filled 2018-09-14: qty 2

## 2018-09-14 MED ORDER — HYDROMORPHONE HCL 1 MG/ML IJ SOLN: 0.2500 mg | INTRAMUSCULAR | Status: DC | PRN

## 2018-09-14 MED ORDER — PHENYLEPHRINE HCL 10 MG/ML IJ SOLN
INTRAMUSCULAR | Status: DC | PRN
  Administered 2018-09-14 (×5): 80 ug via INTRAVENOUS

## 2018-09-14 MED ORDER — ESMOLOL HCL 100 MG/10ML IV SOLN
INTRAVENOUS | Status: AC
  Filled 2018-09-14: qty 10

## 2018-09-14 MED ORDER — HYDRALAZINE HCL 20 MG/ML IJ SOLN
INTRAMUSCULAR | Status: AC
  Filled 2018-09-14: qty 1

## 2018-09-14 MED ORDER — VANCOMYCIN HCL 1000 MG IV SOLR
INTRAVENOUS | Status: DC | PRN
  Administered 2018-09-14: 2 g

## 2018-09-14 MED ORDER — CEFAZOLIN SODIUM-DEXTROSE 2-4 GM/100ML-% IV SOLN
2.0000 g | Freq: Three times a day (TID) | INTRAVENOUS | Status: AC
  Administered 2018-09-14 – 2018-09-15 (×3): 2 g via INTRAVENOUS
  Filled 2018-09-14 (×3): qty 100

## 2018-09-14 MED ORDER — SODIUM CHLORIDE 0.9 % IV SOLN
INTRAVENOUS | Status: DC | PRN
  Administered 2018-09-14: 13:00:00 via INTRAVENOUS

## 2018-09-14 MED ORDER — ROCURONIUM BROMIDE 50 MG/5ML IV SOSY
PREFILLED_SYRINGE | INTRAVENOUS | Status: AC
  Filled 2018-09-14: qty 15

## 2018-09-14 MED ORDER — BACITRACIN 500 UNIT/GM EX OINT
TOPICAL_OINTMENT | CUTANEOUS | Status: DC | PRN
  Administered 2018-09-14: 1 via TOPICAL

## 2018-09-14 MED ORDER — LACTATED RINGERS IV SOLN
INTRAVENOUS | Status: DC | PRN
  Administered 2018-09-14: 11:00:00 via INTRAVENOUS

## 2018-09-14 SURGICAL SUPPLY — 96 items
BANDAGE ACE 4X5 VEL STRL LF (GAUZE/BANDAGES/DRESSINGS) ×4 IMPLANT
BANDAGE ACE 6X5 VEL STRL LF (GAUZE/BANDAGES/DRESSINGS) ×2 IMPLANT
BIT DRILL CALIBRATED 4.2 (BIT) IMPLANT
BIT DRILL LONG 3.3 (BIT) ×2 IMPLANT
BIT DRILL LONG 3.3MM (BIT) ×2
BIT DRILL QC 3.3X195 (BIT) ×2 IMPLANT
BIT DRILL SHORT 3.2MM (DRILL) IMPLANT
BIT DRILL SHORT 4.2 (BIT) IMPLANT
BLADE CLIPPER SURG (BLADE) IMPLANT
BLADE SURG 10 STRL SS (BLADE) ×8 IMPLANT
BNDG CMPR MED 10X6 ELC LF (GAUZE/BANDAGES/DRESSINGS) ×2
BNDG COHESIVE 4X5 TAN STRL (GAUZE/BANDAGES/DRESSINGS) ×4 IMPLANT
BNDG COHESIVE 6X5 TAN STRL LF (GAUZE/BANDAGES/DRESSINGS) ×4 IMPLANT
BNDG ELASTIC 6X10 VLCR STRL LF (GAUZE/BANDAGES/DRESSINGS) ×4 IMPLANT
BNDG GAUZE ELAST 4 BULKY (GAUZE/BANDAGES/DRESSINGS) ×4 IMPLANT
BRUSH SCRUB SURG 4.25 DISP (MISCELLANEOUS) ×8 IMPLANT
CANISTER SUCT 3000ML PPV (MISCELLANEOUS) ×4 IMPLANT
CAP LOCK NCB (Cap) ×16 IMPLANT
CHLORAPREP W/TINT 26ML (MISCELLANEOUS) ×4 IMPLANT
COVER SURGICAL LIGHT HANDLE (MISCELLANEOUS) ×8 IMPLANT
COVER WAND RF STERILE (DRAPES) ×4 IMPLANT
DRAPE C-ARM 42X72 X-RAY (DRAPES) ×4 IMPLANT
DRAPE C-ARMOR (DRAPES) ×6 IMPLANT
DRAPE HALF SHEET 40X57 (DRAPES) ×8 IMPLANT
DRAPE IMP U-DRAPE 54X76 (DRAPES) ×8 IMPLANT
DRAPE INCISE IOBAN 66X45 STRL (DRAPES) IMPLANT
DRAPE ORTHO SPLIT 77X108 STRL (DRAPES) ×16
DRAPE PROXIMA HALF (DRAPES) ×12 IMPLANT
DRAPE SURG 17X23 STRL (DRAPES) ×4 IMPLANT
DRAPE SURG ORHT 6 SPLT 77X108 (DRAPES) ×6 IMPLANT
DRAPE U-SHAPE 47X51 STRL (DRAPES) ×4 IMPLANT
DRILL BIT CALIBRATED 4.2 (BIT) ×4
DRILL BIT SHORT 4.2 (BIT) ×8
DRILL SHORT 3.2MM (DRILL) ×4
DRSG ADAPTIC 3X8 NADH LF (GAUZE/BANDAGES/DRESSINGS) ×10 IMPLANT
DRSG MEPILEX BORDER 4X12 (GAUZE/BANDAGES/DRESSINGS) IMPLANT
DRSG MEPILEX BORDER 4X4 (GAUZE/BANDAGES/DRESSINGS) ×3 IMPLANT
DRSG MEPILEX BORDER 4X8 (GAUZE/BANDAGES/DRESSINGS) ×4 IMPLANT
DRSG PAD ABDOMINAL 8X10 ST (GAUZE/BANDAGES/DRESSINGS) ×10 IMPLANT
ELECT REM PT RETURN 9FT ADLT (ELECTROSURGICAL) ×4
ELECTRODE REM PT RTRN 9FT ADLT (ELECTROSURGICAL) ×2 IMPLANT
GAUZE SPONGE 4X4 12PLY STRL (GAUZE/BANDAGES/DRESSINGS) ×6 IMPLANT
GAUZE SPONGE 4X4 12PLY STRL LF (GAUZE/BANDAGES/DRESSINGS) ×4 IMPLANT
GLOVE BIO SURGEON STRL SZ7.5 (GLOVE) ×16 IMPLANT
GLOVE BIOGEL PI IND STRL 7.5 (GLOVE) ×2 IMPLANT
GLOVE BIOGEL PI INDICATOR 7.5 (GLOVE) ×2
GOWN STRL REUS W/ TWL LRG LVL3 (GOWN DISPOSABLE) ×6 IMPLANT
GOWN STRL REUS W/TWL LRG LVL3 (GOWN DISPOSABLE) ×12
GUIDEWIRE 3.2X400 (WIRE) ×2 IMPLANT
K-WIRE 2.0 (WIRE) ×4
K-WIRE FXSTD 280X2XNS SS (WIRE) ×2
KIT BASIN OR (CUSTOM PROCEDURE TRAY) ×4 IMPLANT
KIT TURNOVER KIT B (KITS) ×4 IMPLANT
KWIRE FXSTD 280X2XNS SS (WIRE) IMPLANT
NAIL CANN TI PROX TIB 10X360 (Nail) ×2 IMPLANT
NS IRRIG 1000ML POUR BTL (IV SOLUTION) ×4 IMPLANT
PACK TOTAL JOINT (CUSTOM PROCEDURE TRAY) ×4 IMPLANT
PAD ARMBOARD 7.5X6 YLW CONV (MISCELLANEOUS) ×8 IMPLANT
PAD CAST 4YDX4 CTTN HI CHSV (CAST SUPPLIES) ×2 IMPLANT
PADDING CAST COTTON 4X4 STRL (CAST SUPPLIES) ×8
PADDING CAST COTTON 6X4 STRL (CAST SUPPLIES) ×7 IMPLANT
PENCIL BUTTON HOLSTER BLD 10FT (ELECTRODE) ×3 IMPLANT
PLATE FEM DIST NCB PP 278MM (Plate) ×2 IMPLANT
REAMER ROD DEEP FLUTE 2.5X950 (INSTRUMENTS) ×3 IMPLANT
SCREW 5.0 80MM (Screw) ×3 IMPLANT
SCREW CORT NCB SELFTAP 5.0X42 (Screw) ×6 IMPLANT
SCREW CORTICAL NCB 5.0X65 (Screw) ×3 IMPLANT
SCREW LOCK 4X44 TI (Screw) ×3 IMPLANT
SCREW LOCK 4X54 TI (Screw) ×2 IMPLANT
SCREW LOCK STAR 5X34 (Screw) ×2 IMPLANT
SCREW LOCK STAR 5X38 (Screw) ×2 IMPLANT
SCREW LOCK STAR 5X44 (Screw) ×6 IMPLANT
SCREW LOCK STAR 5X76 (Screw) ×2 IMPLANT
SCREW LOCK STAR 5X80 (Screw) ×2 IMPLANT
SCREW NCB 4.0MX42M (Screw) ×2 IMPLANT
SCREW NCB 5.0X38 (Screw) ×3 IMPLANT
SCREW NCB 5.0X85MM (Screw) ×9 IMPLANT
SPONGE LAP 18X18 X RAY DECT (DISPOSABLE) IMPLANT
STAPLER VISISTAT 35W (STAPLE) ×4 IMPLANT
SUCTION FRAZIER HANDLE 10FR (MISCELLANEOUS) ×2
SUCTION TUBE FRAZIER 10FR DISP (MISCELLANEOUS) ×2 IMPLANT
SUT ETHILON 3 0 PS 1 (SUTURE) ×20 IMPLANT
SUT MNCRL AB 3-0 PS2 18 (SUTURE) ×4 IMPLANT
SUT VIC AB 0 CT1 27 (SUTURE)
SUT VIC AB 0 CT1 27XBRD ANBCTR (SUTURE) IMPLANT
SUT VIC AB 1 CT1 27 (SUTURE) ×4
SUT VIC AB 1 CT1 27XBRD ANBCTR (SUTURE) IMPLANT
SUT VIC AB 2-0 CT1 27 (SUTURE) ×12
SUT VIC AB 2-0 CT1 TAPERPNT 27 (SUTURE) ×5 IMPLANT
TOWEL OR 17X24 6PK STRL BLUE (TOWEL DISPOSABLE) ×4 IMPLANT
TOWEL OR 17X26 10 PK STRL BLUE (TOWEL DISPOSABLE) ×8 IMPLANT
TRAY FOLEY MTR SLVR 16FR STAT (SET/KITS/TRAYS/PACK) IMPLANT
TUBE CONNECTING 12'X1/4 (SUCTIONS) ×1
TUBE CONNECTING 12X1/4 (SUCTIONS) ×2 IMPLANT
WATER STERILE IRR 1000ML POUR (IV SOLUTION) ×8 IMPLANT
YANKAUER SUCT BULB TIP NO VENT (SUCTIONS) ×3 IMPLANT

## 2018-09-14 NOTE — Anesthesia Preprocedure Evaluation (Addendum)
Anesthesia Evaluation  Patient identified by MRN, date of birth, ID band Patient unresponsive    Reviewed: Allergy & Precautions, H&P , NPO status , Patient's Chart, lab work & pertinent test results  Airway Mallampati: Intubated       Dental no notable dental hx. (+) Teeth Intact, Dental Advisory Given   Pulmonary neg pulmonary ROS,  Intubated and sedated   Pulmonary exam normal breath sounds clear to auscultation       Cardiovascular negative cardio ROS   Rhythm:Regular Rate:Normal     Neuro/Psych negative neurological ROS  negative psych ROS   GI/Hepatic negative GI ROS, Neg liver ROS,   Endo/Other  negative endocrine ROS  Renal/GU negative Renal ROS  negative genitourinary   Musculoskeletal   Abdominal   Peds  Hematology negative hematology ROS (+)   Anesthesia Other Findings   Reproductive/Obstetrics negative OB ROS                            Anesthesia Physical Anesthesia Plan  ASA: III  Anesthesia Plan: General   Post-op Pain Management:    Induction: Intravenous  PONV Risk Score and Plan: 3 and Ondansetron, Dexamethasone and Midazolam  Airway Management Planned: Oral ETT  Additional Equipment:   Intra-op Plan:   Post-operative Plan: Post-operative intubation/ventilation  Informed Consent: I have reviewed the patients History and Physical, chart, labs and discussed the procedure including the risks, benefits and alternatives for the proposed anesthesia with the patient or authorized representative who has indicated his/her understanding and acceptance.   Dental advisory given  Plan Discussed with: CRNA  Anesthesia Plan Comments:       Anesthesia Quick Evaluation

## 2018-09-14 NOTE — Anesthesia Postprocedure Evaluation (Signed)
Anesthesia Post Note  Patient: Derek Moon  Procedure(s) Performed: INTRAMEDULLARY (IM) NAIL TIBIAL (Left Leg Lower) OPEN REDUCTION INTERNAL FIXATION (ORIF) DISTAL FEMUR FRACTURE (Right Leg Upper)     Patient location during evaluation: SICU Anesthesia Type: General Level of consciousness: sedated Pain management: pain level controlled Vital Signs Assessment: post-procedure vital signs reviewed and stable Respiratory status: patient remains intubated per anesthesia plan Cardiovascular status: stable Postop Assessment: no apparent nausea or vomiting Anesthetic complications: no    Last Vitals:  Vitals:   09/14/18 1100 09/14/18 1445  BP: 114/64 115/73  Pulse: 94   Resp: 17   Temp:    SpO2: 98%     Last Pain:  Vitals:   09/14/18 0800  TempSrc: Axillary  PainSc:                  Caramia Boutin,W. EDMOND
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
## 2018-09-14 NOTE — Transfer of Care (Signed)
Immediate Anesthesia Transfer of Care Note  Patient: Derek Moon  Procedure(s) Performed: INTRAMEDULLARY (IM) NAIL TIBIAL (Left Leg Lower) OPEN REDUCTION INTERNAL FIXATION (ORIF) DISTAL FEMUR FRACTURE (Right Leg Upper)  Patient Location: ICU  Anesthesia Type:General  Level of Consciousness: sedated and Patient remains intubated per anesthesia plan  Airway & Oxygen Therapy: Patient remains intubated per anesthesia plan and Patient placed on Ventilator (see vital sign flow sheet for setting)  Post-op Assessment: Report given to RN and Post -op Vital signs reviewed and stable  Post vital signs: Reviewed and stable  Last Vitals:  Vitals Value Taken Time  BP    Temp    Pulse    Resp    SpO2      Last Pain:  Vitals:   09/14/18 0800  TempSrc: Axillary  PainSc:          Complications: No apparent anesthesia complications
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
## 2018-09-15 ENCOUNTER — Encounter (HOSPITAL_COMMUNITY): Payer: Self-pay | Admitting: Student

## 2018-09-15 LAB — ABO/RH: ABO/RH(D): O POS

## 2018-09-15 LAB — CBC
HEMATOCRIT: 28 % — AB (ref 39.0–52.0)
HEMOGLOBIN: 8.5 g/dL — AB (ref 13.0–17.0)
MCH: 28.8 pg (ref 26.0–34.0)
MCHC: 30.4 g/dL (ref 30.0–36.0)
MCV: 94.9 fL (ref 80.0–100.0)
NRBC: 0.1 % (ref 0.0–0.2)
Platelets: 133 10*3/uL — ABNORMAL LOW (ref 150–400)
RBC: 2.95 MIL/uL — AB (ref 4.22–5.81)
RDW: 17.2 % — ABNORMAL HIGH (ref 11.5–15.5)
WBC: 14.3 10*3/uL — ABNORMAL HIGH (ref 4.0–10.5)

## 2018-09-15 LAB — GLUCOSE, CAPILLARY
GLUCOSE-CAPILLARY: 112 mg/dL — AB (ref 70–99)
Glucose-Capillary: 102 mg/dL — ABNORMAL HIGH (ref 70–99)
Glucose-Capillary: 108 mg/dL — ABNORMAL HIGH (ref 70–99)
Glucose-Capillary: 111 mg/dL — ABNORMAL HIGH (ref 70–99)
Glucose-Capillary: 120 mg/dL — ABNORMAL HIGH (ref 70–99)
Glucose-Capillary: 121 mg/dL — ABNORMAL HIGH (ref 70–99)
Glucose-Capillary: 129 mg/dL — ABNORMAL HIGH (ref 70–99)

## 2018-09-15 MED ORDER — ORAL CARE MOUTH RINSE
15.0000 mL | Freq: Two times a day (BID) | OROMUCOSAL | Status: DC
  Administered 2018-09-16 – 2018-09-17 (×5): 15 mL via OROMUCOSAL

## 2018-09-15 MED ORDER — ENOXAPARIN SODIUM 40 MG/0.4ML ~~LOC~~ SOLN
40.0000 mg | SUBCUTANEOUS | Status: DC
  Administered 2018-09-15 – 2018-09-25 (×11): 40 mg via SUBCUTANEOUS
  Filled 2018-09-15 (×11): qty 0.4

## 2018-09-15 MED ORDER — FENTANYL CITRATE (PF) 100 MCG/2ML IJ SOLN
50.0000 ug | INTRAMUSCULAR | Status: DC | PRN
  Administered 2018-09-15 – 2018-09-19 (×20): 100 ug via INTRAVENOUS
  Filled 2018-09-15 (×20): qty 2

## 2018-09-15 MED ORDER — BISACODYL 10 MG RE SUPP
10.0000 mg | Freq: Once | RECTAL | Status: AC
  Administered 2018-09-15: 10 mg via RECTAL
  Filled 2018-09-15: qty 1

## 2018-09-15 MED ORDER — CHLORHEXIDINE GLUCONATE 0.12 % MT SOLN
15.0000 mL | Freq: Two times a day (BID) | OROMUCOSAL | Status: DC
  Administered 2018-09-15 – 2018-09-17 (×5): 15 mL via OROMUCOSAL
  Filled 2018-09-15 (×6): qty 15

## 2018-09-15 MED ORDER — FUROSEMIDE 10 MG/ML IJ SOLN
20.0000 mg | Freq: Once | INTRAMUSCULAR | Status: AC
  Administered 2018-09-15: 20 mg via INTRAVENOUS
  Filled 2018-09-15: qty 2

## 2018-09-15 NOTE — Progress Notes (Signed)
SLP Cancellation Note  Patient Details Name: Derek Moon MRN: [id number] DOB: 1956-10-29   Cancelled treatment:       Reason Eval/Treat Not Completed: Medical issues which prohibited therapy. Pt intubated 10/30-11/5/19, and is currently inappropriate for BSE per RN. Will continue efforts.  Jaquan Sadowsky B. Murvin Natal [facility], CCC-SLP Speech Language Pathologist [phone]  Leigh Aurora 09/15/2018, 2:39 PM
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
## 2018-09-15 NOTE — Progress Notes (Signed)
Patient extubated. Order for one time Lasix ordered and administered. of Fentanyl wasted and witnessed with Philis Nettle, RN.
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
## 2018-09-15 NOTE — Procedures (Signed)
Extubation Procedure Note  Patient Details:   Name: [name] DOB: [birth year] MRN: [id number]   Airway Documentation:    Vent end date: 09/15/18 Vent end time: 1016   Evaluation  O2 sats: stable throughout Complications: No apparent complications Patient did tolerate procedure well. Bilateral Breath Sounds: Rhonchi   Yes  BBS rhonchi/coarse 4l/min [place], sp02 97% Incentive spirometer  Newt Lukes 09/15/2018, 10:17 AM

## 2018-09-16 ENCOUNTER — Encounter (HOSPITAL_COMMUNITY): Payer: Self-pay | Admitting: Student

## 2018-09-16 ENCOUNTER — Inpatient Hospital Stay (HOSPITAL_COMMUNITY): Payer: No Typology Code available for payment source

## 2018-09-16 LAB — CBC
HCT: 29.9 % — ABNORMAL LOW (ref 39.0–52.0)
HEMOGLOBIN: 9.1 g/dL — AB (ref 13.0–17.0)
MCH: 29 pg (ref 26.0–34.0)
MCHC: 30.4 g/dL (ref 30.0–36.0)
MCV: 95.2 fL (ref 80.0–100.0)
PLATELETS: 191 10*3/uL (ref 150–400)
RBC: 3.14 MIL/uL — AB (ref 4.22–5.81)
RDW: 17.5 % — ABNORMAL HIGH (ref 11.5–15.5)
WBC: 15.3 10*3/uL — AB (ref 4.0–10.5)
nRBC: 0.2 % (ref 0.0–0.2)

## 2018-09-16 LAB — GLUCOSE, CAPILLARY
GLUCOSE-CAPILLARY: 100 mg/dL — AB (ref 70–99)
GLUCOSE-CAPILLARY: 96 mg/dL (ref 70–99)
Glucose-Capillary: 97 mg/dL (ref 70–99)
Glucose-Capillary: 106 mg/dL — ABNORMAL HIGH (ref 70–99)
Glucose-Capillary: 101 mg/dL — ABNORMAL HIGH (ref 70–99)
Glucose-Capillary: 102 mg/dL — ABNORMAL HIGH (ref 70–99)

## 2018-09-16 LAB — BASIC METABOLIC PANEL
Anion gap: 2 — ABNORMAL LOW (ref 5–15)
BUN: 25 mg/dL — AB (ref 8–23)
CHLORIDE: 118 mmol/L — AB (ref 98–111)
CO2: 26 mmol/L (ref 22–32)
Calcium: 8 mg/dL — ABNORMAL LOW (ref 8.9–10.3)
Creatinine, Ser: 0.6 mg/dL — ABNORMAL LOW (ref 0.61–1.24)
Glucose, Bld: 109 mg/dL — ABNORMAL HIGH (ref 70–99)
Potassium: 4 mmol/L (ref 3.5–5.1)
SODIUM: 146 mmol/L — AB (ref 135–145)

## 2018-09-16 MED ORDER — METHOCARBAMOL 1000 MG/10ML IJ SOLN
1000.0000 mg | Freq: Three times a day (TID) | INTRAVENOUS | Status: DC | PRN
  Administered 2018-09-16 – 2018-09-18 (×2): 1000 mg via INTRAVENOUS
  Filled 2018-09-16 (×2): qty 10
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
## 2018-09-16 NOTE — Progress Notes (Signed)
Patient ID: Derek Moon, male   DOB: [birth year], 62 y.o.   MRN: [id number] Patient awake alert and follows commands moves all externally is well  No evidence of CSF rhinorrhea  Continue c-collar for now.
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
## 2018-09-17 LAB — BPAM RBC
BLOOD PRODUCT EXPIRATION DATE: 201912012359
BLOOD PRODUCT EXPIRATION DATE: 201912032359
Blood Product Expiration Date: 201912012359
Blood Product Expiration Date: 201912032359
Blood Product Expiration Date: 201912032359
Blood Product Expiration Date: 201912032359
ISSUE DATE / TIME: 201911031104
ISSUE DATE / TIME: 201911031316
ISSUE DATE / TIME: 201911041120
ISSUE DATE / TIME: 201911041120
UNIT TYPE AND RH: 5100
UNIT TYPE AND RH: 5100
Unit Type and Rh: 5100
Unit Type and Rh: 5100
Unit Type and Rh: 5100
Unit Type and Rh: 5100

## 2018-09-17 LAB — CBC
HCT: 29.8 % — ABNORMAL LOW (ref 39.0–52.0)
Hemoglobin: 9.1 g/dL — ABNORMAL LOW (ref 13.0–17.0)
MCH: 29.7 pg (ref 26.0–34.0)
MCHC: 30.5 g/dL (ref 30.0–36.0)
MCV: 97.4 fL (ref 80.0–100.0)
Platelets: 235 10*3/uL (ref 150–400)
RBC: 3.06 MIL/uL — AB (ref 4.22–5.81)
RDW: 18 % — ABNORMAL HIGH (ref 11.5–15.5)
WBC: 14.6 10*3/uL — ABNORMAL HIGH (ref 4.0–10.5)
nRBC: 0.2 % (ref 0.0–0.2)

## 2018-09-17 LAB — GLUCOSE, CAPILLARY
GLUCOSE-CAPILLARY: 106 mg/dL — AB (ref 70–99)
GLUCOSE-CAPILLARY: 108 mg/dL — AB (ref 70–99)
GLUCOSE-CAPILLARY: 190 mg/dL — AB (ref 70–99)
Glucose-Capillary: 89 mg/dL (ref 70–99)
Glucose-Capillary: 97 mg/dL (ref 70–99)

## 2018-09-17 LAB — TYPE AND SCREEN
ABO/RH(D): O POS
Antibody Screen: NEGATIVE
UNIT DIVISION: 0
Unit division: 0
Unit division: 0
Unit division: 0
Unit division: 0
Unit division: 0

## 2018-09-17 MED ORDER — ALBUMIN HUMAN 25 % IV SOLN
12.5000 g | Freq: Once | INTRAVENOUS | Status: AC
  Administered 2018-09-17: 12.5 g via INTRAVENOUS
  Filled 2018-09-17: qty 50

## 2018-09-17 MED ORDER — ACETAMINOPHEN 500 MG PO TABS
1000.0000 mg | ORAL_TABLET | Freq: Four times a day (QID) | ORAL | Status: DC
  Administered 2018-09-17 – 2018-09-25 (×32): 1000 mg via ORAL
  Filled 2018-09-17 (×33): qty 2

## 2018-09-17 MED ORDER — OXYCODONE HCL 5 MG PO TABS
5.0000 mg | ORAL_TABLET | ORAL | Status: DC | PRN
  Administered 2018-09-17 – 2018-09-21 (×15): 10 mg via ORAL
  Filled 2018-09-17 (×15): qty 2

## 2018-09-17 MED ORDER — FAMOTIDINE 20 MG PO TABS
20.0000 mg | ORAL_TABLET | Freq: Two times a day (BID) | ORAL | Status: DC
  Administered 2018-09-17 – 2018-09-25 (×16): 20 mg via ORAL
  Filled 2018-09-17 (×16): qty 1

## 2018-09-17 MED ORDER — LORAZEPAM 2 MG/ML IJ SOLN
0.5000 mg | Freq: Every evening | INTRAMUSCULAR | Status: DC | PRN
  Administered 2018-09-17 – 2018-09-25 (×7): 1 mg via INTRAVENOUS
  Filled 2018-09-17 (×7): qty 1

## 2018-09-17 MED ORDER — RESOURCE THICKENUP CLEAR PO POWD
ORAL | Status: DC | PRN
  Filled 2018-09-17 (×2): qty 125
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
## 2018-09-18 ENCOUNTER — Inpatient Hospital Stay (HOSPITAL_COMMUNITY): Payer: No Typology Code available for payment source

## 2018-09-18 LAB — GLUCOSE, CAPILLARY
GLUCOSE-CAPILLARY: 117 mg/dL — AB (ref 70–99)
GLUCOSE-CAPILLARY: 127 mg/dL — AB (ref 70–99)
GLUCOSE-CAPILLARY: 84 mg/dL (ref 70–99)
GLUCOSE-CAPILLARY: 89 mg/dL (ref 70–99)

## 2018-09-18 LAB — BASIC METABOLIC PANEL
Anion gap: 4 — ABNORMAL LOW (ref 5–15)
BUN: 20 mg/dL (ref 8–23)
CHLORIDE: 114 mmol/L — AB (ref 98–111)
CO2: 25 mmol/L (ref 22–32)
Calcium: 8.2 mg/dL — ABNORMAL LOW (ref 8.9–10.3)
Creatinine, Ser: 0.58 mg/dL — ABNORMAL LOW (ref 0.61–1.24)
GFR calc non Af Amer: >60 mL/min (ref >60)
Glucose, Bld: 99 mg/dL (ref 70–99)
POTASSIUM: 3.6 mmol/L (ref 3.5–5.1)
SODIUM: 143 mmol/L (ref 135–145)

## 2018-09-18 LAB — CBC
HCT: 29.7 % — ABNORMAL LOW (ref 39.0–52.0)
Hemoglobin: 8.9 g/dL — ABNORMAL LOW (ref 13.0–17.0)
MCH: 28.7 pg (ref 26.0–34.0)
MCHC: 30 g/dL (ref 30.0–36.0)
MCV: 95.8 fL (ref 80.0–100.0)
Platelets: 267 10*3/uL (ref 150–400)
RBC: 3.1 MIL/uL — ABNORMAL LOW (ref 4.22–5.81)
RDW: 18.2 % — ABNORMAL HIGH (ref 11.5–15.5)
WBC: 15.1 10*3/uL — ABNORMAL HIGH (ref 4.0–10.5)
nRBC: 0.1 % (ref 0.0–0.2)

## 2018-09-18 MED ORDER — ORAL CARE MOUTH RINSE
15.0000 mL | Freq: Two times a day (BID) | OROMUCOSAL | Status: DC
  Administered 2018-09-18 – 2018-09-25 (×10): 15 mL via OROMUCOSAL
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
## 2018-09-18 NOTE — NC FL2 (Signed)
[place] MEDICAID FL2 LEVEL OF CARE SCREENING TOOL     IDENTIFICATION  Patient Name: Derek Moon Birthdate: 1956/07/07 Sex: male Admission Date (Current Location): 09/09/2018  [facility] and [place] Number:  Producer, television/film/video and Address:  [facility], [address] [place], [place] [address]      Provider Number: [id number]  Attending Physician Name and Address:  Md, Trauma, MD  Relative Name and Phone Number:       Current Level of Care: Hospital Recommended Level of Care: Skilled Nursing Facility Prior Approval Number:    Date Approved/Denied:   PASRR Number: Need to get SS#  Discharge Plan: SNF    Current Diagnoses: Patient Active Problem List   Diagnosis Date Noted   Femur fracture, right (HCC) 09/09/2018   Pelvic fracture (HCC) 09/09/2018    Orientation RESPIRATION BLADDER Height & Weight     Self, Situation, Place  Normal Indwelling catheter Weight: 248 lb 10.9 oz (112.8 kg) Height:  5\' 11"  (180.3 cm)  BEHAVIORAL SYMPTOMS/MOOD NEUROLOGICAL BOWEL NUTRITION STATUS  Other (Comment) (None) Incontinent Diet(DYS 2. Fluid honey thick.)  AMBULATORY STATUS COMMUNICATION OF NEEDS Skin   Extensive Assist(NWB) Verbally Surgical wounds, Skin abrasions, Bruising(Skin tear, weeping. Laceration on left medial arm: Nonadherent, gauze prn.)                       Personal Care Assistance Level of Assistance  Bathing, Feeding, Dressing Bathing Assistance: Maximum assistance Feeding assistance: Maximum assistance Dressing Assistance: Maximum assistance     Functional Limitations Info  Sight, Hearing, Speech Sight Info: Adequate Hearing Info: Adequate Speech Info: Adequate    SPECIAL CARE FACTORS FREQUENCY  PT (By licensed PT), OT (By licensed OT), Speech therapy     PT Frequency: 5 x week OT Frequency: 5 x week     Speech Therapy Frequency: 5 x week      Contractures Contractures Info: Not present    Additional Factors  Info  Code Status, Allergies Code Status Info: Full code Allergies Info: Not on file           Current Medications (09/18/2018):  This is the current hospital active medication list Current Facility-Administered Medications  Medication Dose Route Frequency Provider Last Rate Last Dose   0.9 %  sodium chloride infusion (Manually program via Guardrails IV Fluids)   Intravenous Once Haddix, Gillie Manners, MD       0.9 %  sodium chloride infusion   Intravenous PRN Haddix, Gillie Manners, MD   Stopped at 09/17/18 1226   acetaminophen (TYLENOL) tablet 1,000 mg  1,000 mg Oral Q6H Barnetta Chapel, PA-C   1,000 mg at 09/18/18 1016   dextrose 5 % and 0.45 % NaCl with KCl 20 mEq/L infusion   Intravenous Continuous Haddix, Gillie Manners, MD   Stopped at 09/18/18 1159   enoxaparin (LOVENOX) injection 40 mg  40 mg Subcutaneous Q24H Violeta Gelinas, MD   40 mg at 09/18/18 1006   famotidine (PEPCID) tablet 20 mg  20 mg Oral BID Violeta Gelinas, MD   20 mg at 09/18/18 1006   fentaNYL (SUBLIMAZE) injection 50-100 mcg  50-100 mcg Intravenous Q1H PRN Violeta Gelinas, MD   100 mcg at 09/18/18 1052   LORazepam (ATIVAN) injection 0.5-1 mg  0.5-1 mg Intravenous QHS PRN Barnetta Chapel, PA-C   1 mg at 09/17/18 2138   MEDLINE mouth rinse  15 mL Mouth Rinse BID Violeta Gelinas, MD   15 mL at 09/18/18  1015   methocarbamol (ROBAXIN) 1,000 mg in dextrose 5 % 50 mL IVPB  1,000 mg Intravenous Q8H PRN Violeta Gelinas, MD   Stopped at 09/18/18 0123   oxyCODONE (Oxy IR/ROXICODONE) immediate release tablet 5-10 mg  5-10 mg Oral Q4H PRN Barnetta Chapel, PA-C   10 mg at 09/18/18 1312   RESOURCE THICKENUP CLEAR   Oral PRN Violeta Gelinas, MD         Discharge Medications: Please see discharge summary for a list of discharge medications.  Relevant Imaging Results:  Relevant Lab Results:   Additional Information Need to get SS#. Letter of guarantee.  Margarito Liner, LCSW
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
## 2018-09-18 NOTE — Progress Notes (Signed)
Pt sitting in chair, vitals stable. Pain medicine given at 1100. Tolerating Dys 2 diet, honey thick liquids. Report called to [employer]. Will transfer to [id number].

## 2018-09-18 NOTE — Progress Notes (Signed)
NEUROSURGERY PROGRESS NOTE  Doing well. Complains of very little neck pain.  No numbness, tingling or weakness   Temp:  [97.7 F (36.5 C)-99.7 F (37.6 C)] 98.9 F (37.2 C) (11/08 0800) Pulse Rate:  [53-99] 87 (11/08 0800) Resp:  [16-34] 23 (11/08 0800) BP: (99-136)/(56-87) 123/71 (11/08 0800) SpO2:  [92 %-100 %] 99 % (11/08 0800) Weight:  [112.8 kg] 112.8 kg (11/08 0500)  Plan: Will get flex/ext xrays cervical spine since per nurse, he keeps taking off his aspen collar.  Sherryl Manges, NP 09/18/2018 11:00 AM

## 2018-09-19 ENCOUNTER — Inpatient Hospital Stay (HOSPITAL_COMMUNITY): Payer: No Typology Code available for payment source

## 2018-09-19 MED ORDER — FENTANYL CITRATE (PF) 100 MCG/2ML IJ SOLN: 50.0000 ug | INTRAMUSCULAR | Status: DC | PRN

## 2018-09-19 MED ORDER — MORPHINE SULFATE (PF) 4 MG/ML IV SOLN
4.0000 mg | INTRAVENOUS | Status: DC | PRN
  Administered 2018-09-19 – 2018-09-21 (×11): 4 mg via INTRAVENOUS
  Filled 2018-09-19 (×12): qty 1
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
## 2018-09-19 NOTE — Progress Notes (Signed)
NEUROSURGERY PROGRESS NOTE  Doing well. Complains of some very mild neck pain at times.    Temp:  [97.6 F (36.4 C)-99.2 F (37.3 C)] 97.6 F (36.4 C) (11/09 0300) Pulse Rate:  [85-97] 97 (11/09 0300) Resp:  [19-27] 27 (11/08 2000) BP: (108-126)/(63-79) 125/79 (11/09 0300) SpO2:  [94 %-98 %] 98 % (11/09 0300)  Plan: Flex/ex xrays look stable from what I can tell because the collar was left on, no ideal imaging. Will continue collar for now since he still has some mild neck pain. No new nsgy recom.  Sherryl Manges, NP 09/19/2018 8:18 AM

## 2018-09-20 LAB — BASIC METABOLIC PANEL
ANION GAP: 5 (ref 5–15)
BUN: 15 mg/dL (ref 8–23)
CO2: 24 mmol/L (ref 22–32)
Calcium: 7.8 mg/dL — ABNORMAL LOW (ref 8.9–10.3)
Chloride: 108 mmol/L (ref 98–111)
Creatinine, Ser: 0.68 mg/dL (ref 0.61–1.24)
GFR calc Af Amer: >60 mL/min (ref >60)
GFR calc non Af Amer: >60 mL/min (ref >60)
GLUCOSE: 87 mg/dL (ref 70–99)
POTASSIUM: 3.9 mmol/L (ref 3.5–5.1)
Sodium: 137 mmol/L (ref 135–145)

## 2018-09-20 LAB — CBC
HCT: 31.6 % — ABNORMAL LOW (ref 39.0–52.0)
Hemoglobin: 9.3 g/dL — ABNORMAL LOW (ref 13.0–17.0)
MCH: 29 pg (ref 26.0–34.0)
MCHC: 29.4 g/dL — ABNORMAL LOW (ref 30.0–36.0)
MCV: 98.4 fL (ref 80.0–100.0)
Platelets: 329 10*3/uL (ref 150–400)
RBC: 3.21 MIL/uL — AB (ref 4.22–5.81)
RDW: 18.6 % — ABNORMAL HIGH (ref 11.5–15.5)
WBC: 20.6 10*3/uL — ABNORMAL HIGH (ref 4.0–10.5)
nRBC: 0 % (ref 0.0–0.2)
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
## 2018-09-21 ENCOUNTER — Inpatient Hospital Stay (HOSPITAL_COMMUNITY): Payer: No Typology Code available for payment source

## 2018-09-21 DIAGNOSIS — S7222XA Displaced subtrochanteric fracture of left femur, initial encounter for closed fracture: Secondary | ICD-10-CM

## 2018-09-21 DIAGNOSIS — M7989 Other specified soft tissue disorders: Secondary | ICD-10-CM

## 2018-09-21 DIAGNOSIS — R52 Pain, unspecified: Secondary | ICD-10-CM

## 2018-09-21 DIAGNOSIS — R609 Edema, unspecified: Secondary | ICD-10-CM

## 2018-09-21 DIAGNOSIS — S2249XA Multiple fractures of ribs, unspecified side, initial encounter for closed fracture: Secondary | ICD-10-CM

## 2018-09-21 DIAGNOSIS — S82202A Unspecified fracture of shaft of left tibia, initial encounter for closed fracture: Secondary | ICD-10-CM

## 2018-09-21 DIAGNOSIS — S72002A Fracture of unspecified part of neck of left femur, initial encounter for closed fracture: Secondary | ICD-10-CM

## 2018-09-21 LAB — URINALYSIS, ROUTINE W REFLEX MICROSCOPIC
BILIRUBIN URINE: NEGATIVE
Glucose, UA: NEGATIVE mg/dL
KETONES UR: NEGATIVE mg/dL
NITRITE: POSITIVE — AB
Protein, ur: 100 mg/dL — AB
RBC / HPF: >50 RBC/hpf — ABNORMAL HIGH (ref 0–5)
SPECIFIC GRAVITY, URINE: 1.021 (ref 1.005–1.030)
pH: 6 (ref 5.0–8.0)

## 2018-09-21 MED ORDER — OXYCODONE HCL 5 MG PO TABS
10.0000 mg | ORAL_TABLET | ORAL | Status: DC | PRN
  Administered 2018-09-21: 10 mg via ORAL
  Administered 2018-09-21: 15 mg via ORAL
  Administered 2018-09-22: 10 mg via ORAL
  Administered 2018-09-22 – 2018-09-25 (×11): 15 mg via ORAL
  Filled 2018-09-21 (×2): qty 2
  Filled 2018-09-21 (×12): qty 3

## 2018-09-21 MED ORDER — DOCUSATE SODIUM 100 MG PO CAPS: 100.0000 mg | ORAL_CAPSULE | Freq: Two times a day (BID) | ORAL | Status: DC

## 2018-09-21 MED ORDER — MORPHINE SULFATE (PF) 4 MG/ML IV SOLN
4.0000 mg | INTRAVENOUS | Status: DC | PRN
  Administered 2018-09-21 – 2018-09-24 (×5): 4 mg via INTRAVENOUS
  Filled 2018-09-21 (×4): qty 1

## 2018-09-21 MED ORDER — POLYETHYLENE GLYCOL 3350 17 G PO PACK
17.0000 g | PACK | Freq: Every day | ORAL | Status: DC
  Administered 2018-09-23: 17 g via ORAL
  Filled 2018-09-21 (×4): qty 1

## 2018-09-21 MED ORDER — DOCUSATE SODIUM 50 MG/5ML PO LIQD
50.0000 mg | Freq: Two times a day (BID) | ORAL | Status: DC
  Administered 2018-09-23 (×2): 50 mg via ORAL
  Filled 2018-09-21 (×6): qty 10

## 2018-09-21 MED ORDER — METHOCARBAMOL 500 MG PO TABS
750.0000 mg | ORAL_TABLET | Freq: Four times a day (QID) | ORAL | Status: DC | PRN
  Administered 2018-09-21 – 2018-09-24 (×6): 750 mg via ORAL
  Filled 2018-09-21 (×7): qty 2
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
## 2018-09-21 NOTE — Progress Notes (Signed)
Preliminary notes--Bilateral lower extremities venous duplex exam completed. Negative for DVT where veins are visualized. Limited study due to multiple wound on side with severe edema/pain. Exam cannot completely exclude the possibility of thrombosis existence.  Derek Moon H Derek Moon(RDMS RVT) 09/21/18 12:29 PM
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
## 2018-09-22 ENCOUNTER — Encounter (HOSPITAL_COMMUNITY): Payer: Self-pay

## 2018-09-22 ENCOUNTER — Inpatient Hospital Stay (HOSPITAL_COMMUNITY): Payer: No Typology Code available for payment source

## 2018-09-22 LAB — CBC WITH DIFFERENTIAL/PLATELET
ABS IMMATURE GRANULOCYTES: 0.42 10*3/uL — AB (ref 0.00–0.07)
BASOS ABS: 0.1 10*3/uL (ref 0.0–0.1)
Basophils Relative: 0 %
Eosinophils Absolute: 0.2 10*3/uL (ref 0.0–0.5)
Eosinophils Relative: 1 %
HCT: 36.2 % — ABNORMAL LOW (ref 39.0–52.0)
HEMOGLOBIN: 11.3 g/dL — AB (ref 13.0–17.0)
IMMATURE GRANULOCYTES: 2 %
Lymphocytes Relative: 9 %
Lymphs Abs: 2 10*3/uL (ref 0.7–4.0)
MCH: 30.6 pg (ref 26.0–34.0)
MCHC: 31.2 g/dL (ref 30.0–36.0)
MCV: 98.1 fL (ref 80.0–100.0)
MONO ABS: 1.5 10*3/uL — AB (ref 0.1–1.0)
Monocytes Relative: 7 %
NEUTROS ABS: 17.6 10*3/uL — AB (ref 1.7–7.7)
NRBC: 0 % (ref 0.0–0.2)
Neutrophils Relative %: 81 %
Platelets: 376 10*3/uL (ref 150–400)
RBC: 3.69 MIL/uL — AB (ref 4.22–5.81)
RDW: 18.6 % — ABNORMAL HIGH (ref 11.5–15.5)
WBC: 21.8 10*3/uL — AB (ref 4.0–10.5)

## 2018-09-22 MED ORDER — SODIUM CHLORIDE 0.9 % IV SOLN
2.0000 g | Freq: Every day | INTRAVENOUS | Status: DC
  Administered 2018-09-22 – 2018-09-24 (×3): 2 g via INTRAVENOUS
  Filled 2018-09-22 (×4): qty 20
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
## 2018-09-22 NOTE — Progress Notes (Signed)
Orthopedic Tech Progress Note Patient Details:  Derek Moon 1956/07/18 [id number]     Post Interventions Instructions Provided: Care of device Ortho Devices Type of Ortho Device: Abduction pillow, Prafo boot/shoe   Post Interventions Instructions Provided: Care of device   Saul FordyceJennifer C Ahnya Akre 09/22/2018, 10:50 AM
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
## 2018-09-22 NOTE — Plan of Care (Signed)
°  Problem: Education: Goal: Knowledge of General Education information will improve Description Including pain rating scale, medication(s)/side effects and non-pharmacologic comfort measures Outcome: Progressing   Problem: Health Behavior/Discharge Planning: Goal: Ability to manage health-related needs will improve Outcome: Progressing   Problem: Clinical Measurements: Goal: Ability to maintain clinical measurements within normal limits will improve Outcome: Progressing   Problem: Clinical Measurements: Goal: Will remain free from infection Outcome: Progressing   Problem: Clinical Measurements: Goal: Diagnostic test results will improve Outcome: Progressing   Problem: Clinical Measurements: Goal: Respiratory complications will improve Outcome: Progressing   Problem: Clinical Measurements: Goal: Cardiovascular complication will be avoided Outcome: Progressing

## 2018-09-23 ENCOUNTER — Encounter (HOSPITAL_COMMUNITY): Payer: Self-pay | Admitting: General Practice

## 2018-09-23 ENCOUNTER — Other Ambulatory Visit: Payer: Self-pay

## 2018-09-23 LAB — BASIC METABOLIC PANEL
ANION GAP: 4 — AB (ref 5–15)
BUN: 14 mg/dL (ref 8–23)
CALCIUM: 7.9 mg/dL — AB (ref 8.9–10.3)
CO2: 24 mmol/L (ref 22–32)
Chloride: 105 mmol/L (ref 98–111)
Creatinine, Ser: 0.67 mg/dL (ref 0.61–1.24)
GFR calc Af Amer: >60 mL/min (ref >60)
GLUCOSE: 104 mg/dL — AB (ref 70–99)
Potassium: 3.9 mmol/L (ref 3.5–5.1)
Sodium: 133 mmol/L — ABNORMAL LOW (ref 135–145)

## 2018-09-23 LAB — CBC
HCT: 33 % — ABNORMAL LOW (ref 39.0–52.0)
HEMOGLOBIN: 10 g/dL — AB (ref 13.0–17.0)
MCH: 29.7 pg (ref 26.0–34.0)
MCHC: 30.3 g/dL (ref 30.0–36.0)
MCV: 97.9 fL (ref 80.0–100.0)
Platelets: 437 10*3/uL — ABNORMAL HIGH (ref 150–400)
RBC: 3.37 MIL/uL — AB (ref 4.22–5.81)
RDW: 18.6 % — ABNORMAL HIGH (ref 11.5–15.5)
WBC: 15.3 10*3/uL — ABNORMAL HIGH (ref 4.0–10.5)
nRBC: 0 % (ref 0.0–0.2)

## 2018-09-23 MED ORDER — TRAMADOL HCL 50 MG PO TABS
50.0000 mg | ORAL_TABLET | Freq: Four times a day (QID) | ORAL | Status: DC
  Administered 2018-09-23 – 2018-09-25 (×10): 50 mg via ORAL
  Filled 2018-09-23 (×10): qty 1

## 2018-09-23 NOTE — Progress Notes (Signed)
Patient ID: Derek BathWilliam Moon, male   DOB: Jul 30, 1956, 62 y.o.   MRN: [id number] Flex ex negative okay to DC cervical collar
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
## 2018-09-23 NOTE — Clinical Social Work Note (Signed)
Clinical Social Worker continuing to follow patient and family for support and discharge planning needs.  CSW spoke with patient again at bedside to discuss SNF options.  Patient is aware of bed offers and will communicate with friends/family tonight to make a decision.  Patient continues to refuse to provide current social security number - patient requested CSW return again tomorrow for facility choice and possible SSN.  CSW remains available for support and to facilitate patient discharge needs once medically stable.  Macario Golds, [place] [phone]

## 2018-09-23 NOTE — Progress Notes (Signed)
I completed a follow up visit with the patient and delivered the Bible he requested. I provided spiritual support by listening to the patient with compassion and I shared words of encouragement. I shared that the Chaplain is available for additional support as needed or requested.    09/23/18 1609  Clinical Encounter Type  Visited With Patient  Visit Type Spiritual support  Referral From Nurse  Consult/Referral To Chaplain  Spiritual Encounters  Spiritual Needs Literature;Prayer  Advance Directives (For Healthcare)  Does Patient Have a Medical Advance Directive? No  Would patient like information on creating a medical advance directive? Yes (Inpatient - patient defers creating a medical advance directive at this time) (packet provided)  Mental Health Advance Directives  Does Patient Have a Mental Health Advance Directive? No  Would patient like information on creating a mental health advance directive? Yes (Inpatient - patient defers creating a mental health advance directive at this time) (packet provided)    Chaplain Dr Melvyn NovasMichael Ineta Sinning
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
## 2018-09-23 NOTE — Progress Notes (Signed)
Pt has worsening scrotal edema and ecchymosis with 4+ pitting as well as unrelieved hip and pelvic pain. Pt states that his bottom "feels numb but painful at the same time, probably from not being able to change positions".Pt has been without Aspen collar during the night and has tolerated well.

## 2018-09-24 LAB — CBC
HEMATOCRIT: 32.9 % — AB (ref 39.0–52.0)
Hemoglobin: 10 g/dL — ABNORMAL LOW (ref 13.0–17.0)
MCH: 29.9 pg (ref 26.0–34.0)
MCHC: 30.4 g/dL (ref 30.0–36.0)
MCV: 98.2 fL (ref 80.0–100.0)
NRBC: 0 % (ref 0.0–0.2)
PLATELETS: 407 10*3/uL — AB (ref 150–400)
RBC: 3.35 MIL/uL — ABNORMAL LOW (ref 4.22–5.81)
RDW: 18.1 % — AB (ref 11.5–15.5)
WBC: 10.9 10*3/uL — ABNORMAL HIGH (ref 4.0–10.5)

## 2018-09-24 LAB — BASIC METABOLIC PANEL
Anion gap: 6 (ref 5–15)
BUN: 12 mg/dL (ref 8–23)
CALCIUM: 7.8 mg/dL — AB (ref 8.9–10.3)
CHLORIDE: 103 mmol/L (ref 98–111)
CO2: 24 mmol/L (ref 22–32)
CREATININE: 0.62 mg/dL (ref 0.61–1.24)
GFR calc non Af Amer: >60 mL/min (ref >60)
Glucose, Bld: 89 mg/dL (ref 70–99)
Potassium: 4 mmol/L (ref 3.5–5.1)
SODIUM: 133 mmol/L — AB (ref 135–145)

## 2018-09-24 LAB — URINE CULTURE
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
## 2018-09-24 NOTE — NC FL2 (Signed)
[place] MEDICAID FL2 LEVEL OF CARE SCREENING TOOL     IDENTIFICATION  Patient Name: Derek Moon Birthdate: 1956/06/29 Sex: male Admission Date (Current Location): 09/09/2018  [facility] and [place] Number:  Producer, television/film/video and Address:  [facility], [address] [place], [place] [address]      Provider Number: [id number]  Attending Physician Name and Address:  Md, Trauma, MD  Relative Name and Phone Number:       Current Level of Care: Hospital Recommended Level of Care: Skilled Nursing Facility Prior Approval Number:    Date Approved/Denied:   PASRR Number: [id number]  Discharge Plan: SNF    Current Diagnoses: Patient Active Problem List   Diagnosis Date Noted   Fracture of femoral neck, left (HCC) 09/21/2018   Closed left subtrochanteric femur fracture, initial encounter (HCC) 09/21/2018   Fracture of tibial shaft, left, closed 09/21/2018   Fracture of multiple ribs 09/21/2018   Closed displaced supracondylar fracture of distal end of right femur without intracondylar extension (HCC) 09/09/2018   Multiple closed pelvic fractures with disruption of pelvic circle, initial encounter (HCC) 09/09/2018    Orientation RESPIRATION BLADDER Height & Weight     Self, Situation, Place  Normal Indwelling catheter Weight: 231 lb 0.7 oz (104.8 kg) Height:  5\' 11"  (180.3 cm)  BEHAVIORAL SYMPTOMS/MOOD NEUROLOGICAL BOWEL NUTRITION STATUS  Other (Comment) (None) Incontinent Diet(DYS 2. Fluid honey thick.)  AMBULATORY STATUS COMMUNICATION OF NEEDS Skin   Extensive Assist(NWB) Verbally Surgical wounds, Skin abrasions, Bruising(Skin tear, weeping. Laceration on left medial arm: Nonadherent, gauze prn.)                       Personal Care Assistance Level of Assistance  Bathing, Feeding, Dressing Bathing Assistance: Maximum assistance Feeding assistance: Maximum assistance Dressing Assistance: Maximum assistance     Functional  Limitations Info  Sight, Hearing, Speech Sight Info: Adequate Hearing Info: Adequate Speech Info: Adequate    SPECIAL CARE FACTORS FREQUENCY  PT (By licensed PT), OT (By licensed OT), Speech therapy     PT Frequency: 5 x week OT Frequency: 5 x week     Speech Therapy Frequency: 5 x week      Contractures Contractures Info: Not present    Additional Factors Info  Code Status, Allergies Code Status Info: Full code Allergies Info: Not on file           Current Medications (09/24/2018):  This is the current hospital active medication list Current Facility-Administered Medications  Medication Dose Route Frequency Provider Last Rate Last Dose   0.9 %  sodium chloride infusion (Manually program via Guardrails IV Fluids)   Intravenous Once Haddix, Gillie Manners, MD       0.9 %  sodium chloride infusion   Intravenous PRN Haddix, Gillie Manners, MD 10 mL/hr at 09/22/18 1104 250 mL at 09/22/18 1104   acetaminophen (TYLENOL) tablet 1,000 mg  1,000 mg Oral Q6H Barnetta Chapel, PA-C   1,000 mg at 09/24/18 1123   cefTRIAXone (ROCEPHIN) 2 g in sodium chloride 0.9 % 100 mL IVPB  2 g Intravenous Daily Focht, Kassidy Dockendorf L, PA 200 mL/hr at 09/24/18 1138 2 g at 09/24/18 1138   docusate (COLACE) 50 MG/5ML liquid 50 mg  50 mg Oral BID Focht, Roena Sassaman L, PA   50 mg at 09/23/18 2129   enoxaparin (LOVENOX) injection 40 mg  40 mg Subcutaneous Q24H Violeta Gelinas, MD   40 mg at 09/24/18 1126  famotidine (PEPCID) tablet 20 mg  20 mg Oral BID Violeta Gelinashompson, Burke, MD   20 mg at 09/24/18 1123   LORazepam (ATIVAN) injection 0.5-1 mg  0.5-1 mg Intravenous QHS PRN Barnetta Chapelsborne, Kelly, PA-C   1 mg at 09/22/18 2134   MEDLINE mouth rinse  15 mL Mouth Rinse BID Violeta Gelinashompson, Burke, MD   15 mL at 09/24/18 1127   methocarbamol (ROBAXIN) tablet 750 mg  750 mg Oral Q6H PRN Focht, Khalilah Hoke L, PA   750 mg at 09/24/18 1122   morphine 4 MG/ML injection 4 mg  4 mg Intravenous Q4H PRN Focht, Marcelline Temkin L, PA   4 mg at 09/23/18 1734   oxyCODONE  (Oxy IR/ROXICODONE) immediate release tablet 10-15 mg  10-15 mg Oral Q4H PRN Focht, Maryanne Huneycutt L, PA   15 mg at 09/24/18 1121   polyethylene glycol (MIRALAX / GLYCOLAX) packet 17 g  17 g Oral Daily Mattie MarlinFocht, Tamkia Temples L, PA   17 g at 09/23/18 0953   RESOURCE THICKENUP CLEAR   Oral PRN Violeta Gelinashompson, Burke, MD       traMADol Janean Sark(ULTRAM) tablet 50 mg  50 mg Oral Q6H Focht, Daziyah Cogan L, PA   50 mg at 09/24/18 1124     Discharge Medications: Please see discharge summary for a list of discharge medications.  Relevant Imaging Results:  Relevant Lab Results:   Additional Information SSN [id number]   Macario GoldsJesse Danelle Curiale, [place] [phone]

## 2018-09-24 NOTE — Clinical Social Work Note (Signed)
Clinical Social Worker continuing to follow patient for support and discharge planning needs.  Patient has accepted a bed at [facility] with potential plans to discharge tomorrow.  Patient was able to meet with facility liaison and have questions answered.  CSW remains available for support and to facilitate patient discharge needs once medically stable.  Macario Golds, [place] [phone]

## 2018-09-25 MED ORDER — CIPROFLOXACIN HCL 500 MG PO TABS: 500.0000 mg | ORAL_TABLET | Freq: Two times a day (BID) | ORAL | Status: DC

## 2018-09-25 MED ORDER — POLYETHYLENE GLYCOL 3350 17 G PO PACK: 17.0000 g | PACK | Freq: Every day | ORAL | 0 refills | Status: AC

## 2018-09-25 MED ORDER — ENOXAPARIN SODIUM 40 MG/0.4ML ~~LOC~~ SOLN: 40.0000 mg | SUBCUTANEOUS | Status: AC

## 2018-09-25 MED ORDER — OXYCODONE HCL 10 MG PO TABS: 10.0000 mg | ORAL_TABLET | ORAL | 0 refills | Status: AC | PRN

## 2018-09-25 MED ORDER — CIPROFLOXACIN HCL 500 MG PO TABS: 500.0000 mg | ORAL_TABLET | Freq: Two times a day (BID) | ORAL | 0 refills | Status: AC

## 2018-09-25 MED ORDER — ACETAMINOPHEN 500 MG PO TABS: 1000.0000 mg | ORAL_TABLET | Freq: Four times a day (QID) | ORAL | 0 refills | Status: AC

## 2018-09-25 MED ORDER — TRAMADOL HCL 50 MG PO TABS: 50.0000 mg | ORAL_TABLET | Freq: Four times a day (QID) | ORAL | 0 refills | Status: AC

## 2018-09-25 MED ORDER — METHOCARBAMOL 750 MG PO TABS: 750.0000 mg | ORAL_TABLET | Freq: Four times a day (QID) | ORAL | Status: AC | PRN

## 2018-09-25 MED ORDER — FAMOTIDINE 20 MG PO TABS: 20.0000 mg | ORAL_TABLET | Freq: Two times a day (BID) | ORAL | Status: AC

## 2018-09-25 MED ORDER — DOCUSATE SODIUM 50 MG/5ML PO LIQD: 50.0000 mg | Freq: Two times a day (BID) | ORAL | 0 refills | Status: AC

## 2018-09-25 NOTE — Progress Notes (Signed)
[facility] , report called and given to [facility]. [facility] given [facility] staff. Patient personal belongings given to patient from security office/safe and patient signed for them. Family aware of discharge plan.   Caitlan Chauca, Kae HellerMiranda Lynn, RN

## 2018-09-25 NOTE — Discharge Instructions (Signed)
DVT/PE prophylaxis:             lovenox              Would keep on anticoagulation until pt is ambulatory   Activity:             Bed to chair transfers x 8 weeks                         Lift or slide             NWB B LEx x 8 weeks             No ROM restrictions  NWB B LEx                         TED hose B                          No ROM restrictions B LEx                         Aggressive knee and hip ROM                                                                                                   abduction pillow to bring his legs closer and try to get some internal rotation                          B PRAFO boots on when not working with therapies to maintain ankle in neutral position, will likely require B AFOs once he is ambulatory                                                               PT- please teach HEP for B knee ROM- AROM, PROM. No ROM restrictions. Quad sets, SLR, LAQ, SAQ, heel slides, stretching, prone flexion and extension. Ankle theraband program, heel cord stretching, toe towel curls, etc, PROM program                           No pillows under bend of knee when at rest, ok to place under heel to help work on extension. Can also use zero knee bone foam if available  - Wound care B LEx            leave wounds open to air             daily cleansing of B LEx wounds with soap and water             Would leave wounds open to air               Leave sutures  in B LEx until 11/18   Leave foley in until swelling of scrotum and penis improves.   1. PAIN CONTROL:  1. Pain is best controlled by a usual combination of three different methods TOGETHER:  1. Ice/Heat 2. Over the counter pain medication 3. Prescription pain medication 2. Most patients will experience some swelling and bruising around wounds. Ice packs or heating pads (30-60 minutes up to 6 times a day) will help. Use ice for the first few days to help decrease swelling and bruising, then switch to  heat to help relax tight/sore spots and speed recovery. Some people prefer to use ice alone, heat alone, alternating between ice & heat. Experiment to what works for you. Swelling and bruising can take several weeks to resolve.  3. It is helpful to take an over-the-counter pain medication regularly for the first few weeks. Choose one of the following that works best for you:  1. Naproxen (Aleve, etc) Two 220mg  tabs twice a day 2. Ibuprofen (Advil, etc) Three 200mg  tabs four times a day (every meal & bedtime) 3. Acetaminophen (Tylenol, etc) 500-650mg  four times a day (every meal & bedtime) 4. A prescription for pain medication (such as oxycodone, hydrocodone, etc) should be given to you upon discharge. Take your pain medication as prescribed.  1. If you are having problems/concerns with the prescription medicine (does not control pain, nausea, vomiting, rash, itching, etc), please call us [phone] to see if we need to switch you to a different pain medicine that will work better for you and/or control your side effect better. 2. If you need a refill on your pain medication, please contact your pharmacy. They will contact our office to request authorization. Prescriptions will not be filled after 5 pm or on week-ends. 4. Avoid getting constipated. When taking pain medications, it is common to experience some constipation. Increasing fluid intake and taking a fiber supplement (such as Metamucil, Citrucel, FiberCon, MiraLax, etc) 1-2 times a day regularly will usually help prevent this problem from occurring. A mild laxative (prune juice, Milk of Magnesia, MiraLax, etc) should be taken according to package directions if there are no bowel movements after 48 hours.  5. Watch out for diarrhea. If you have many loose bowel movements, simplify your diet to bland foods & liquids for a few days. Stop any stool softeners and decrease your fiber supplement. Switching to mild anti-diarrheal medications  (Kayopectate, Pepto Bismol) can help. If this worsens or does not improve, please call us. 6. Wash / shower every day. You may shower daily and replace your bandges after showering. No bathing or submerging your wounds in water until they heal.   WHEN TO CALL us [phone]:  1. Poor pain control 2. Reactions / problems with new medications (rash/itching, nausea, etc)  3. Fever over 101.5 F (38.5 C) 4. Worsening swelling or bruising 5. Continued bleeding from wounds. 6. Increased pain, redness, or drainage from the wounds which could be signs of infection  The clinic staff is available to answer your questions during regular business hours (8:30am-5pm). Please dont hesitate to call and ask to speak to one of our nurses for clinical concerns.  If you have a medical emergency, go to the nearest emergency room or call 911.  A surgeon from [facility] is always on call at the [facility], [place]  [address], [place], [place] [address] ?  MAIN: [phone] ? TOLL FREE: [phone] ?  FAX [phone]  [email or web address]

## 2018-09-25 NOTE — Plan of Care (Signed)
  Problem: Education: Goal: Knowledge of General Education information will improve Description Including pain rating scale, medication(s)/side effects and non-pharmacologic comfort measures Outcome: Completed/Met   Problem: Health Behavior/Discharge Planning: Goal: Ability to manage health-related needs will improve Outcome: Completed/Met   Problem: Clinical Measurements: Goal: Ability to maintain clinical measurements within normal limits will improve Outcome: Completed/Met Goal: Will remain free from infection Outcome: Completed/Met Goal: Diagnostic test results will improve Outcome: Completed/Met Goal: Respiratory complications will improve Outcome: Completed/Met Goal: Cardiovascular complication will be avoided Outcome: Completed/Met   Problem: Activity: Goal: Risk for activity intolerance will decrease Outcome: Completed/Met   Problem: Nutrition: Goal: Adequate nutrition will be maintained Outcome: Completed/Met   Problem: Coping: Goal: Level of anxiety will decrease Outcome: Completed/Met   Problem: Elimination: Goal: Will not experience complications related to bowel motility Outcome: Completed/Met Goal: Will not experience complications related to urinary retention Outcome: Completed/Met   Problem: Pain Managment: Goal: General experience of comfort will improve Outcome: Completed/Met   Problem: Safety: Goal: Ability to remain free from injury will improve Outcome: Completed/Met   Problem: Skin Integrity: Goal: Risk for impaired skin integrity will decrease Outcome: Completed/Met   Problem: Education: Goal: Required Educational Video(s) Outcome: Completed/Met   Problem: Clinical Measurements: Goal: Ability to maintain clinical measurements within normal limits will improve Outcome: Completed/Met Goal: Postoperative complications will be avoided or minimized Outcome: Completed/Met   Problem: Skin Integrity: Goal: Demonstration of wound healing  without infection will improve Outcome: Completed/Met   Problem: Spiritual Needs Goal: Ability to function at adequate level Outcome: Completed/Met

## 2018-09-25 NOTE — Clinical Social Work Placement (Signed)
   CLINICAL SOCIAL WORK PLACEMENT  NOTE  Date:  09/25/2018  Patient Details  Name: Derek Moon MRN: [id number] Date of Birth: 11-30-1955  Clinical Social Work is seeking post-discharge placement for this patient at the Skilled  Nursing Facility level of care (*CSW will initial, date and re-position this form in  chart as items are completed):  Yes   Patient/family provided with [facility] Clinical Social Work Department's list of facilities offering this level of care within the geographic area requested by the patient (or if unable, by the patient's family).  Yes   Patient/family informed of their freedom to choose among providers that offer the needed level of care, that participate in Medicare, Medicaid or managed care program needed by the patient, have an available bed and are willing to accept the patient.  Yes   Patient/family informed of [facility]'s ownership interest in [facility] and [facility], as well as of the fact that they are under no obligation to receive care at these facilities.  PASRR submitted to EDS on 09/24/18     PASRR number received on 09/24/18     Existing PASRR number confirmed on       FL2 transmitted to all facilities in geographic area requested by pt/family on 09/16/18     FL2 transmitted to all facilities within larger geographic area on       Patient informed that his/her managed care company has contracts with or will negotiate with certain facilities, including the following:        Yes   Patient/family informed of bed offers received.  Patient [facility]     Physician recommends and patient chooses bed at      Patient to be transferred to [facility] on 09/25/18.  Patient to be transferred to facility by Ambulance     Patient family notified on 09/25/18 of transfer.  Name of family member notified:  Patient to notify family     PHYSICIAN Please sign FL2, Please prepare  priority discharge summary, including medications     Additional Comment:   Macario Golds, LCSW [phone]

## 2018-09-25 NOTE — Clinical Social Work Note (Signed)
Clinical Social Worker facilitated patient discharge including contacting patient and facility to confirm patient discharge plans.  Clinical information faxed to facility and patient agreeable with plan.  CSW arranged [facility] via [facility].  RN to call report prior to discharge.  Clinical Social Worker will sign off for now as social work intervention is no longer needed. Please consult us again if new need arises.  Macario GoldsJesse Lorriane Dehart, [place] [phone]

## 2020-04-06 IMAGING — RF DG FEMUR 2+V*R*
1 series · 6 of 6 positions shown · non-contrast
Comparison: Earlier same day.

CLINICAL DATA: Fixation of bilateral femoral and pelvic fractures.

EXAM:
RIGHT FEMUR 2 VIEWS

[Series 1: run · 6 of 6 slices shown]
[im 1/6]
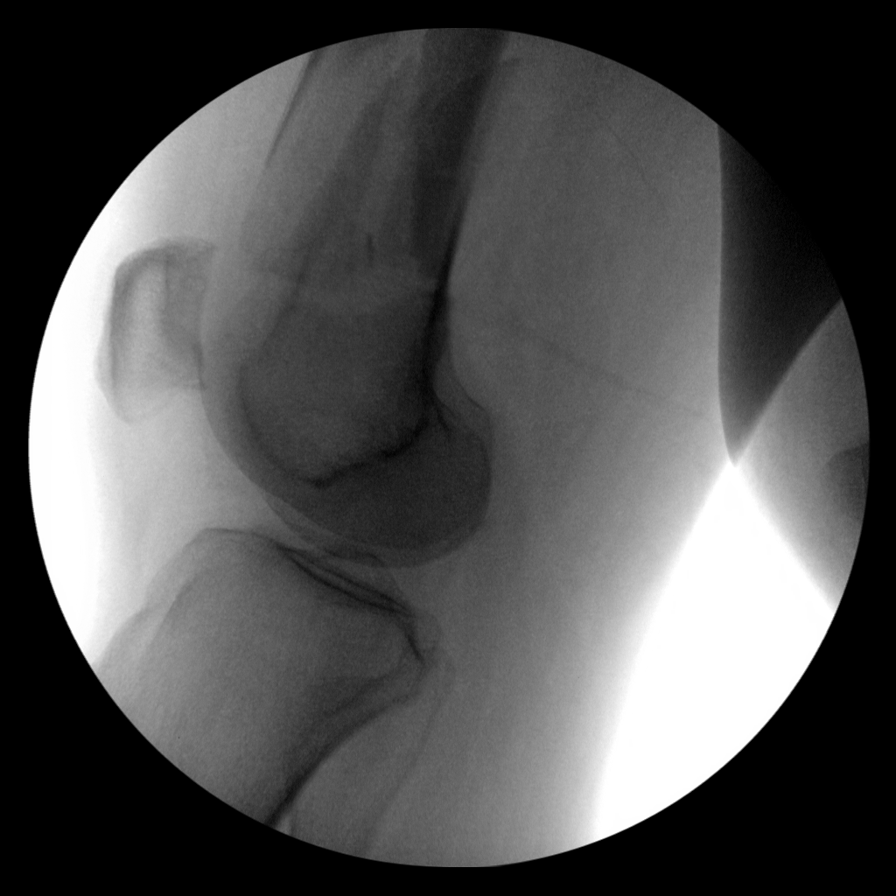
[im 2/6]
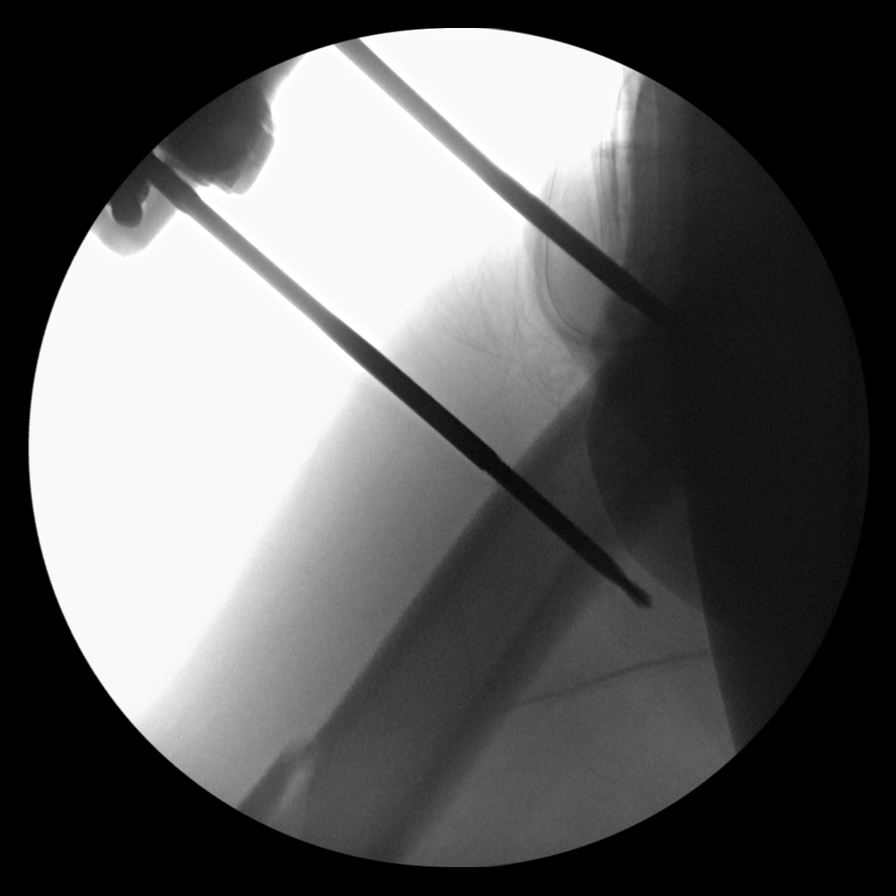
[im 3/6]
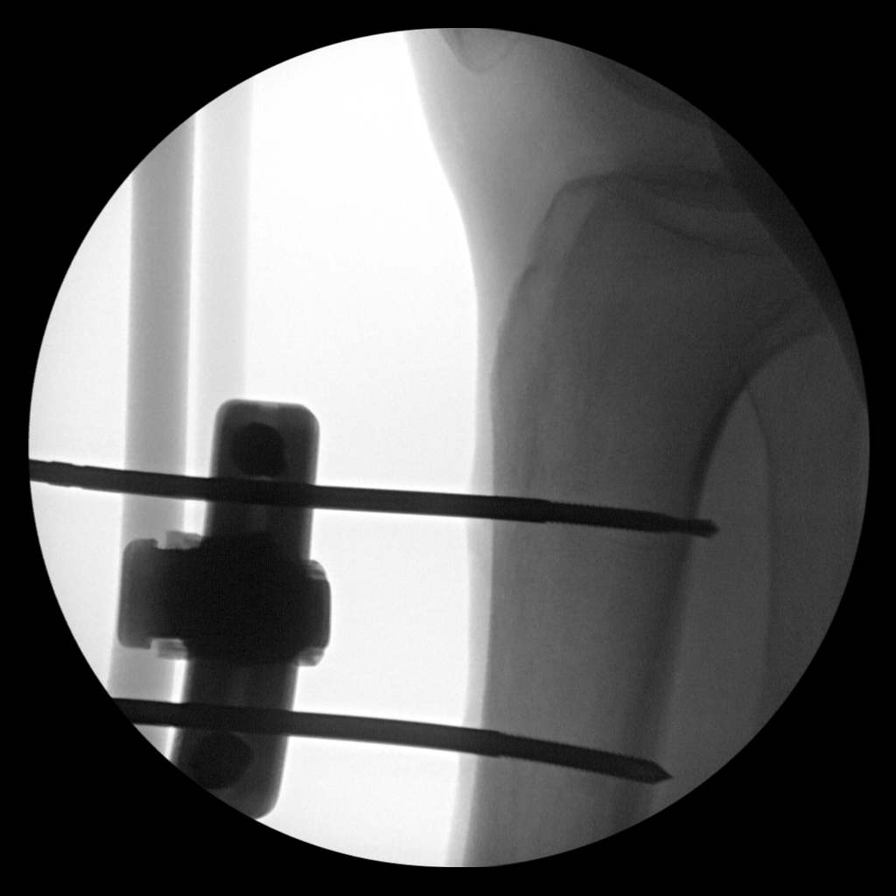
[im 4/6]
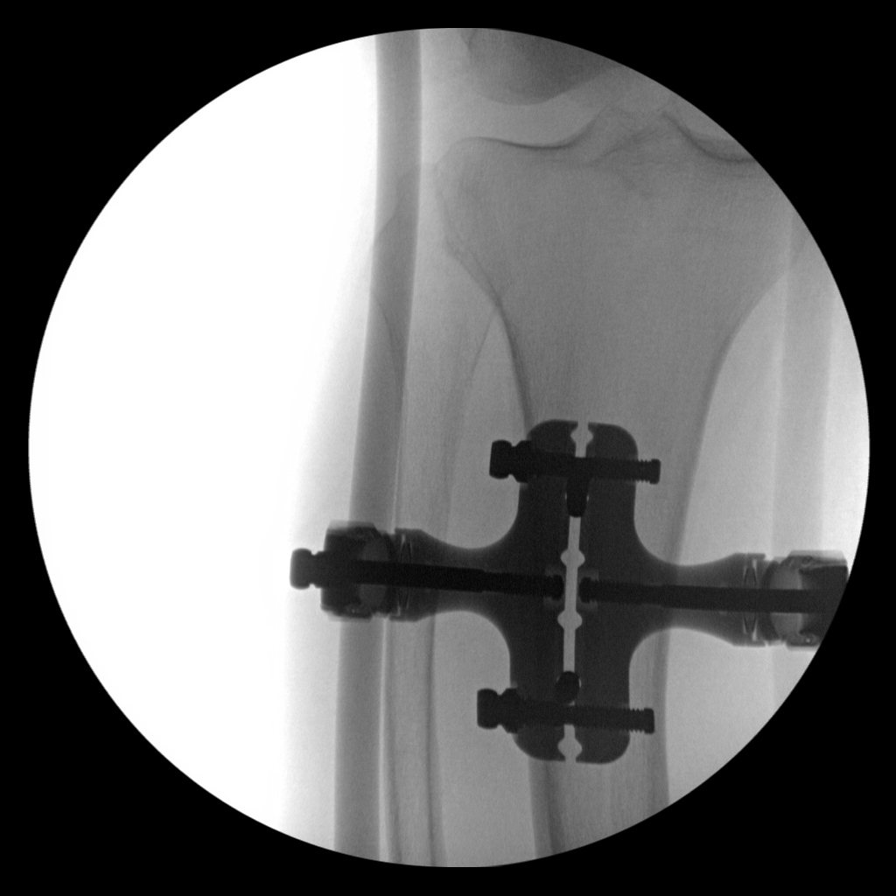
[im 5/6]
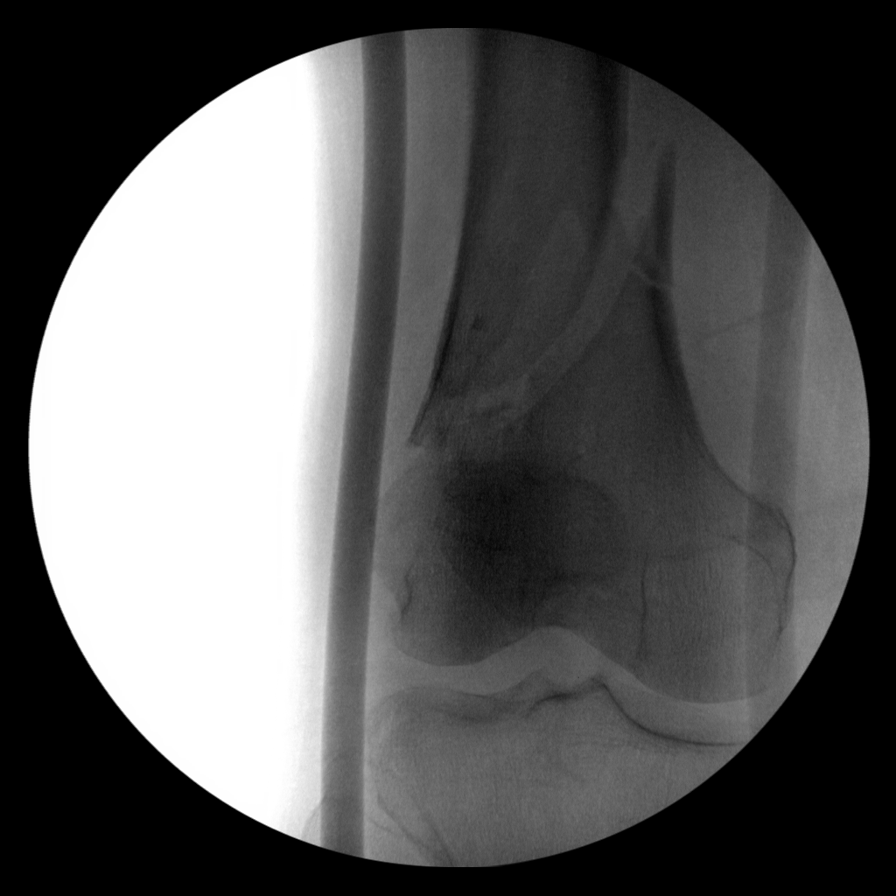
[im 6/6]
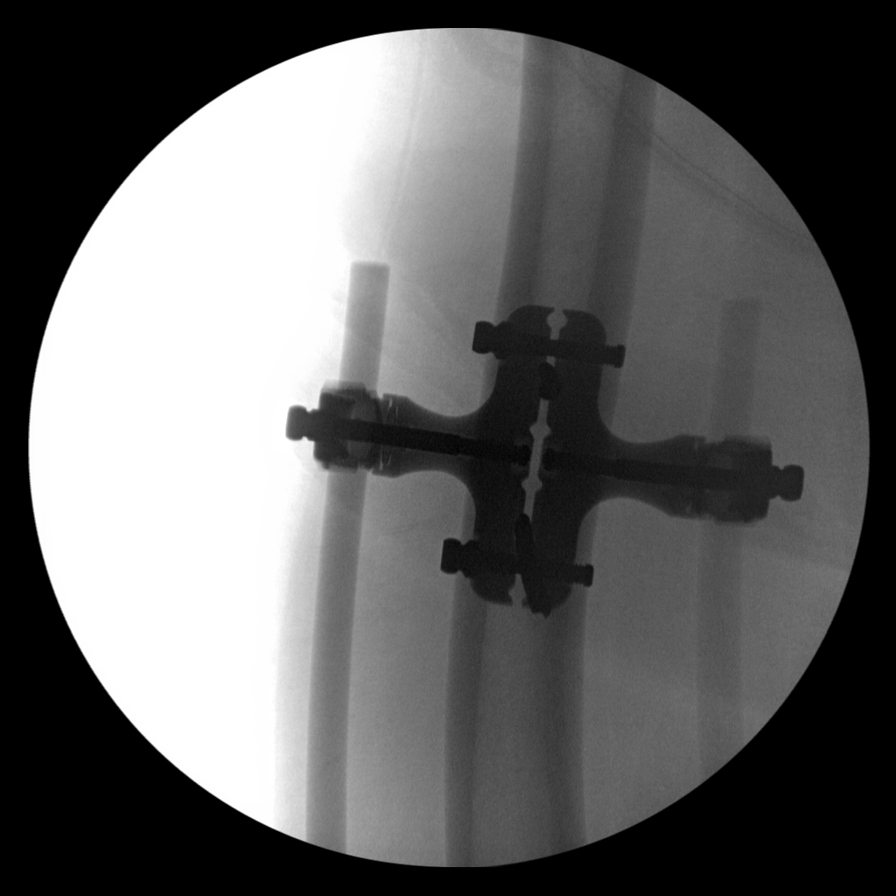

[6 of 6 positions shown; findings below may reference images not displayed]

FINDINGS: Exam demonstrates external fixation hardware bridging patient's
distal displaced oblique fracture of the femoral metaphysis.
IMPRESSION: External fixation hardware in place fixating distal femoral
fracture.
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
## 2020-04-06 IMAGING — CR DG CHEST 1V PORT
1 series · 1 of 1 positions shown · non-contrast
Comparison: Chest CT dated 09/09/2018

CLINICAL DATA: Interval placement of a central line.

EXAM:
PORTABLE CHEST 1 VIEW

[AP]
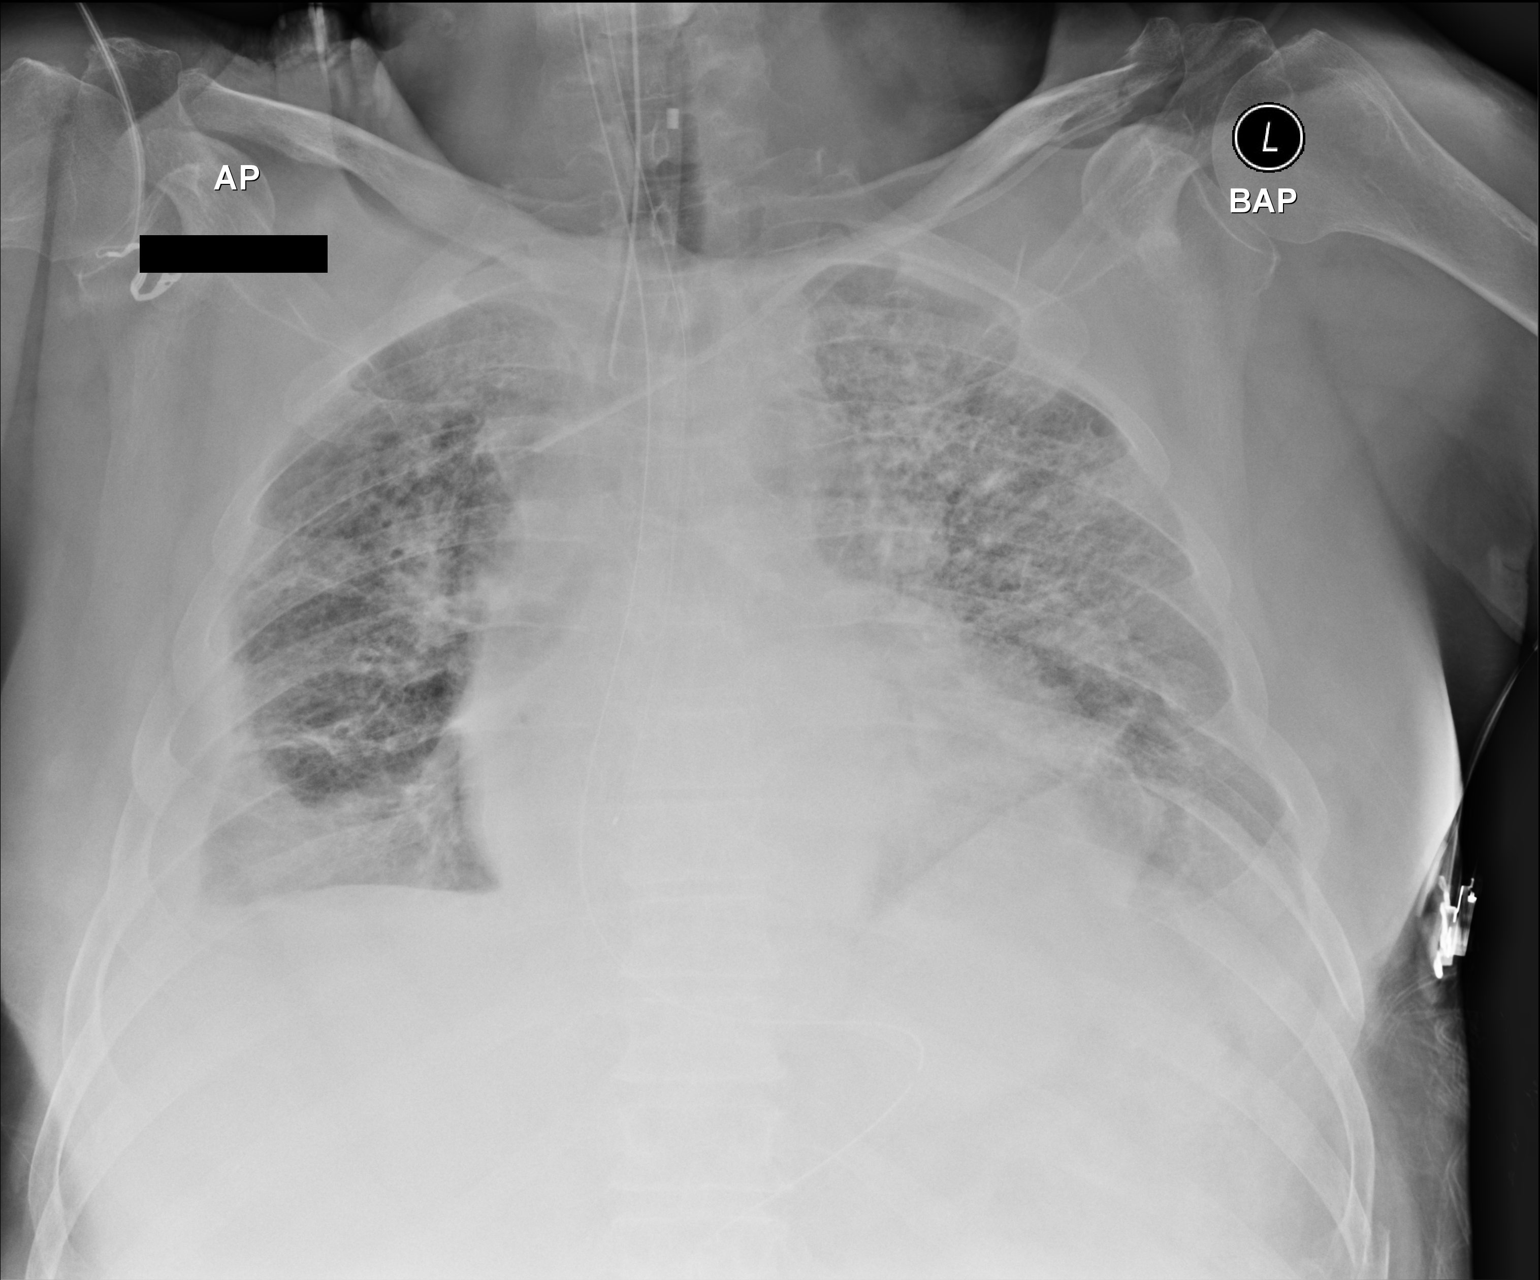

[1 of 1 positions shown; findings below may reference images not displayed]

FINDINGS: There has been interval placement of a left subclavian central
venous line with tip over upper SVC at the level of the innominate
vein. Endotracheal tube remains approximately 4.2 cm above the
carina. An enteric tube extends below the diaphragm with side-port
and tip in the epigastric area.

Emphysema and diffuse interstitial coarsening and densities as seen
on the prior CT. Small right pleural effusion and right lung base
atelectasis. No pneumothorax. Top-normal cardiac size. Left lateral
rib fractures. Additional known fractures better seen on the prior
CT.
IMPRESSION: Interval placement of a left subclavian central venous line with tip
over upper SVC. No pneumothorax.
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
## 2020-04-06 IMAGING — RF DG FEMUR 2+V PORT*L*
1 series · 7 of 7 positions shown · non-contrast
Comparison: Earlier same day.

CLINICAL DATA: External fixation bilateral femoral and pelvic
fractures.

EXAM:
LEFT FEMUR PORTABLE 2 VIEWS

[Series 1: run · 7 of 7 slices shown]
[im 1/7]
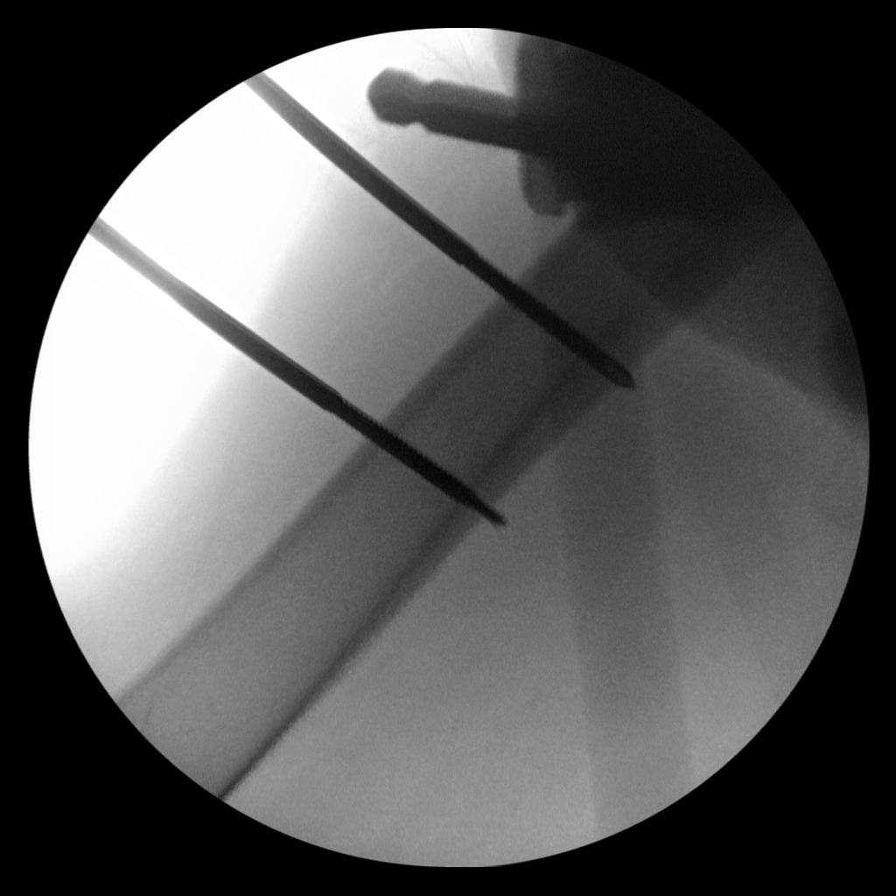
[im 2/7]
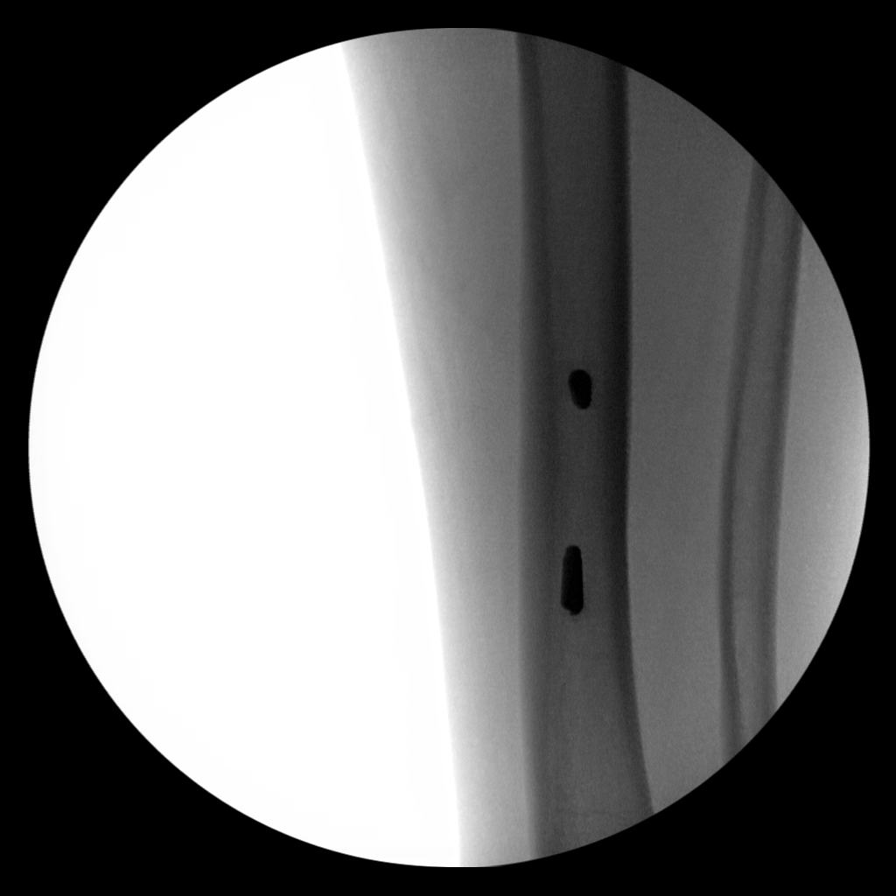
[im 3/7]
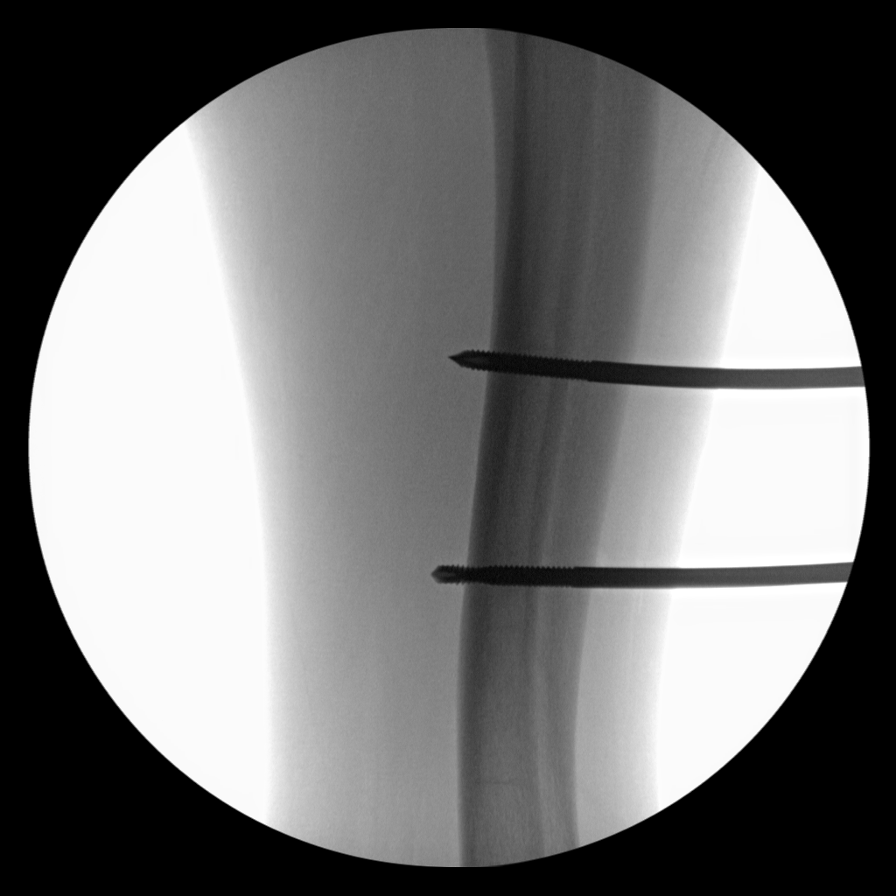
[im 4/7]
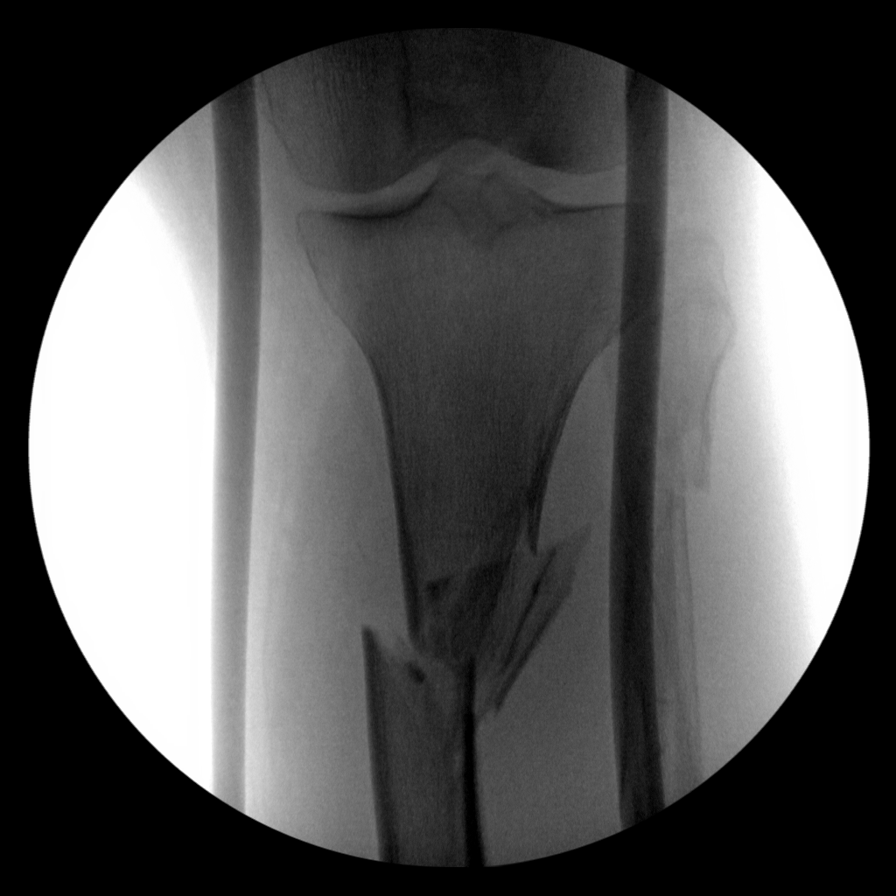
[im 5/7]
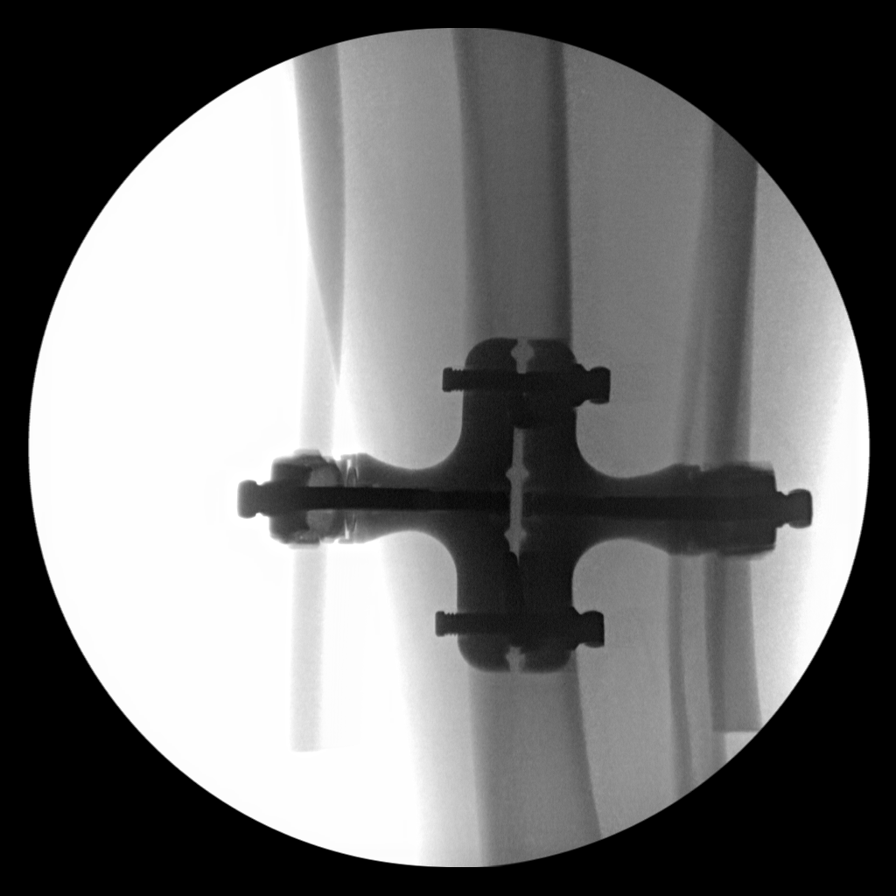
[im 6/7]
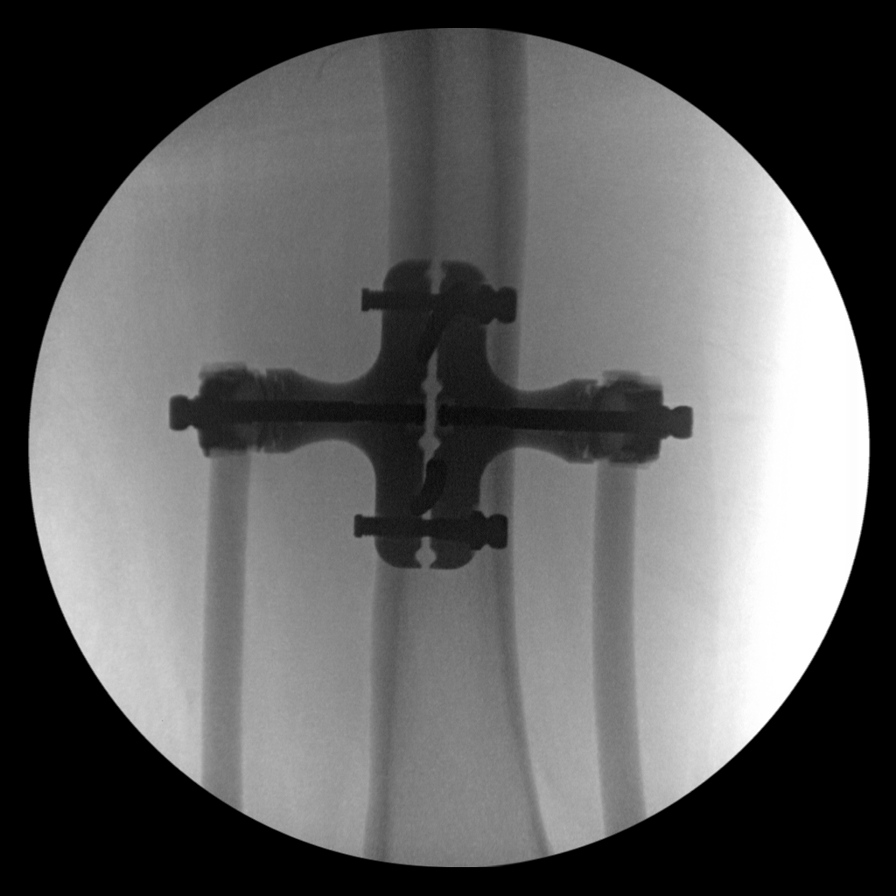
[im 7/7]
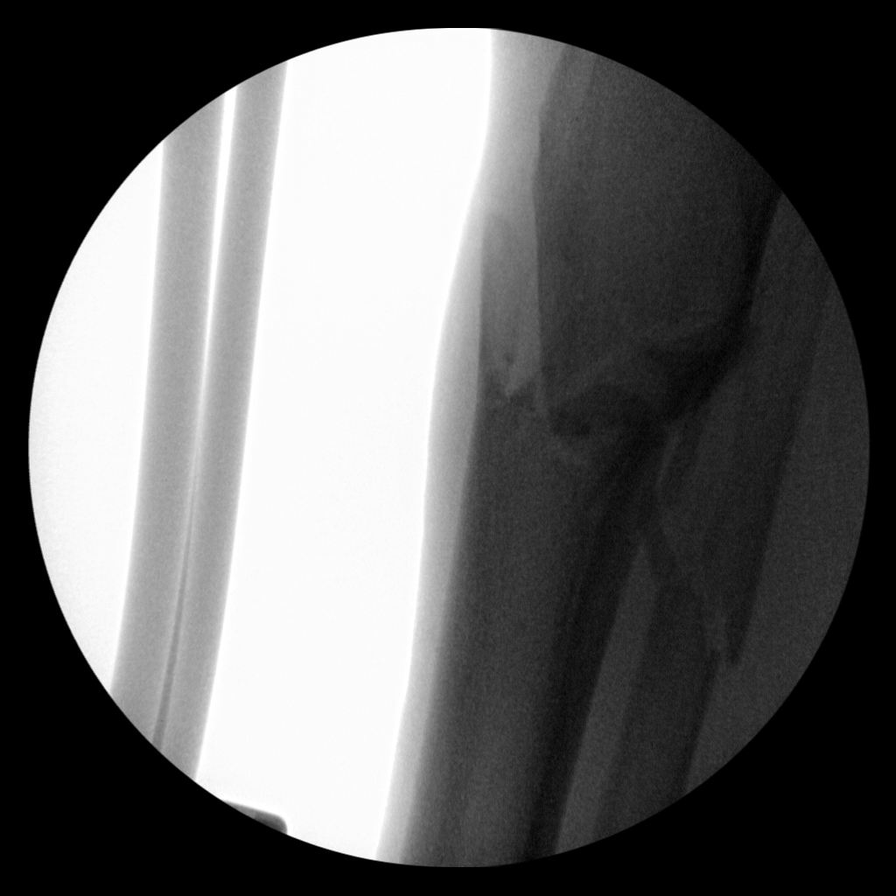

[7 of 7 positions shown; findings below may reference images not displayed]

FINDINGS: Exam demonstrates external fixation hardware bridging patient's
proximal tibial diaphyseal displaced/comminuted fracture. There is
evidence of patient's displaced proximal fibular fracture.
IMPRESSION: Placement of external fixation hardware fixating patient's displaced
slightly comminuted proximal tibial diaphyseal fracture. Known
proximal fibular fracture.

## 2023-11-12 DEATH — deceased
